# Patient Record
Sex: Male | Born: 1990 | Race: White | Hispanic: No | Marital: Married
Health system: Midwestern US, Academic
[De-identification: ages and names within clinical notes are randomized; demographics above are authoritative.]

---

## 2017-10-23 ENCOUNTER — Encounter: Admit: 2017-10-23 | Discharge: 2017-10-23 | Payer: Private health insurance—other commercial Indemnity

## 2017-10-23 ENCOUNTER — Ambulatory Visit: Admit: 2017-10-23 | Discharge: 2017-10-23 | Payer: Private Health Insurance - Indemnity

## 2017-10-23 ENCOUNTER — Encounter: Admit: 2017-10-23 | Discharge: 2017-10-24 | Payer: Private health insurance—other commercial Indemnity

## 2017-10-24 ENCOUNTER — Encounter: Admit: 2017-10-24 | Discharge: 2017-10-24 | Payer: Private health insurance—other commercial Indemnity

## 2017-10-24 ENCOUNTER — Encounter: Admit: 2017-10-24 | Discharge: 2017-10-24 | Payer: Private Health Insurance - Indemnity

## 2017-10-24 ENCOUNTER — Ambulatory Visit: Admit: 2017-10-24 | Discharge: 2017-10-25 | Payer: Private Health Insurance - Indemnity

## 2017-10-24 DIAGNOSIS — I499 Cardiac arrhythmia, unspecified: Principal | ICD-10-CM

## 2017-10-24 DIAGNOSIS — R42 Dizziness and giddiness: ICD-10-CM

## 2017-10-24 DIAGNOSIS — R000 Tachycardia, unspecified: Principal | ICD-10-CM

## 2017-10-24 DIAGNOSIS — R0602 Shortness of breath: ICD-10-CM

## 2017-10-24 DIAGNOSIS — R079 Chest pain, unspecified: Secondary | ICD-10-CM

## 2017-10-24 MED ORDER — METOPROLOL TARTRATE 25 MG PO TAB
25 mg | Freq: Once | ORAL | 0 refills | Status: DC
Start: 2017-10-24 — End: 2017-10-25

## 2017-10-24 MED ORDER — ENOXAPARIN 40 MG/0.4 ML SC SYRG
40 mg | Freq: Every day | SUBCUTANEOUS | 0 refills | Status: DC
Start: 2017-10-24 — End: 2017-10-28
  Administered 2017-10-27: 01:00:00 40 mg via SUBCUTANEOUS

## 2017-10-24 MED ORDER — MAGNESIUM SULFATE IN D5W 1 GRAM/100 ML IV PGBK
1 g | Freq: Once | INTRAVENOUS | 0 refills | Status: CP
Start: 2017-10-24 — End: ?
  Administered 2017-10-25: 05:00:00 1 g via INTRAVENOUS

## 2017-10-24 MED ORDER — POTASSIUM CHLORIDE 20 MEQ PO TBTQ
60 meq | Freq: Once | ORAL | 0 refills | Status: CP
Start: 2017-10-24 — End: ?
  Administered 2017-10-25: 05:00:00 60 meq via ORAL

## 2017-10-24 MED ORDER — IMS MIXTURE TEMPLATE
15 mg | Freq: Two times a day (BID) | ORAL | 0 refills | Status: DC
Start: 2017-10-24 — End: 2017-10-28
  Administered 2017-10-26 – 2017-10-28 (×8): 15 mg via ORAL

## 2017-10-24 MED ORDER — IBUPROFEN 600 MG PO TAB
600 mg | Freq: Once | ORAL | 0 refills | Status: AC
Start: 2017-10-24 — End: ?

## 2017-10-24 MED ORDER — METOPROLOL TARTRATE 25 MG PO TAB
25 mg | Freq: Once | ORAL | 0 refills | Status: CP
Start: 2017-10-24 — End: ?
  Administered 2017-10-25: 02:00:00 25 mg via ORAL

## 2017-10-24 MED ORDER — ACETAMINOPHEN 325 MG PO TAB
650 mg | ORAL | 0 refills | Status: DC | PRN
Start: 2017-10-24 — End: 2017-10-28
  Administered 2017-10-25 – 2017-10-28 (×3): 650 mg via ORAL

## 2017-10-25 ENCOUNTER — Encounter: Admit: 2017-10-25 | Discharge: 2017-10-25 | Payer: Private Health Insurance - Indemnity

## 2017-10-25 ENCOUNTER — Observation Stay: Admit: 2017-10-25 | Discharge: 2017-10-28 | Payer: Private Health Insurance - Indemnity

## 2017-10-25 DIAGNOSIS — I499 Cardiac arrhythmia, unspecified: Principal | ICD-10-CM

## 2017-10-25 LAB — URINALYSIS DIPSTICK
Lab: NEGATIVE
Lab: NEGATIVE K/UL (ref 3–12)
Lab: NEGATIVE MMOL/L (ref 21–30)
Lab: NEGATIVE U/L (ref 25–110)
Lab: NEGATIVE U/L (ref 7–40)
Lab: NEGATIVE

## 2017-10-25 LAB — OSMOLALITY-URINE RANDOM: Lab: 663 mosm/kg (ref 50–1400)

## 2017-10-25 LAB — PROTEINASE 3 ANTIBODIES

## 2017-10-25 LAB — CBC AND DIFF
Lab: 0.3 K/UL (ref 0–0.45)
Lab: 6.5 10*3/uL (ref 4.5–11.0)

## 2017-10-25 LAB — THYROID STIMULATING HORMONE-TSH: Lab: 2.9 uU/mL (ref 0.35–5.00)

## 2017-10-25 LAB — CREATINE KINASE-CPK: Lab: 121 U/L (ref 35–232)

## 2017-10-25 LAB — COMPREHENSIVE METABOLIC PANEL
Lab: 140 MMOL/L (ref 137–147)
Lab: 3.6 MMOL/L (ref 3.5–5.1)
Lab: 137 MMOL/L (ref 137–147)
Lab: 4.3 MMOL/L (ref 3.5–5.1)

## 2017-10-25 LAB — LIPID PROFILE
Lab: 170 mg/dL (ref <200)
Lab: 59 mg/dL (ref >40)
Lab: 63 mg/dL — ABNORMAL HIGH (ref <150)
Lab: 95 mg/dL (ref <100)

## 2017-10-25 LAB — CBC: Lab: 5.8 10*3/uL (ref 4.5–11.0)

## 2017-10-25 LAB — C4 COMPLEMENT 4: Lab: 36 mg/dL (ref 10–49)

## 2017-10-25 LAB — CELIAC SCREEN

## 2017-10-25 LAB — HEPATITIS B CORE AB TOT (IGG+IGM): Lab: NEGATIVE % — ABNORMAL HIGH (ref 5–15)

## 2017-10-25 LAB — FREE T4-FREE THYROXINE: Lab: 0.9 ng/dL (ref 0.6–1.6)

## 2017-10-25 LAB — C3 COMPLEMENT 3: Lab: 149 mg/dL (ref 88–200)

## 2017-10-25 LAB — HEPATITIS B SURFACE AB

## 2017-10-25 LAB — MAGNESIUM: Lab: 1.9 mg/dL (ref 1.6–2.6)

## 2017-10-25 LAB — SED RATE: Lab: 6 mm/h (ref 0–15)

## 2017-10-25 LAB — 25-OH VITAMIN D (D2 + D3): Lab: 24 ng/mL — ABNORMAL LOW (ref 30–80)

## 2017-10-25 LAB — HEPATITIS A IGM: Lab: NEGATIVE % (ref 48–68)

## 2017-10-25 LAB — URINALYSIS, MICROSCOPIC

## 2017-10-25 LAB — CCP IGG ANTIBODY

## 2017-10-25 LAB — GLIADIN, DEAMIDATED IGA: Lab: 2.7 U/mL (ref <15)

## 2017-10-25 LAB — VARICELLA ZOSTER AB IGG: Lab: POSITIVE /HPF (ref 0–3)

## 2017-10-25 LAB — TSH WITH FREE T4 REFLEX: Lab: 9.5 uU/mL — ABNORMAL HIGH (ref 0.35–5.00)

## 2017-10-25 LAB — MYELOPEROXIDASE AB

## 2017-10-25 LAB — ANTI SMITH(SM) ANTI RNP AB

## 2017-10-25 LAB — EBV DNA, PCR QUANT BLOOD

## 2017-10-25 LAB — RHEUMATOID FACTOR (RF): Lab: <10 [IU]/mL (ref <25)

## 2017-10-25 LAB — HIV 1& 2 AG-AB SCRN W REFLEX HIV 1 PCR QUANT: Lab: NEGATIVE mL/min

## 2017-10-25 LAB — C REACTIVE PROTEIN (CRP): Lab: 0.1 mg/dL (ref <1.0)

## 2017-10-25 MED ORDER — IOPAMIDOL 76 % IV SOLN
100 mL | Freq: Once | INTRAVENOUS | 0 refills | Status: CP
Start: 2017-10-25 — End: ?
  Administered 2017-10-25: 17:00:00 100 mL via INTRAVENOUS

## 2017-10-25 MED ORDER — METOPROLOL TARTRATE 25 MG PO TAB
25 mg | Freq: Once | ORAL | 0 refills | Status: CP
Start: 2017-10-25 — End: ?
  Administered 2017-10-25: 14:00:00 25 mg via ORAL

## 2017-10-25 MED ORDER — NITROGLYCERIN 400 MCG/SPRAY TL SPRY
1-2 | 0 refills | Status: DC | PRN
Start: 2017-10-25 — End: 2017-10-28
  Administered 2017-10-25: 17:00:00 2

## 2017-10-25 MED ORDER — DIPHENHYDRAMINE HCL 50 MG/ML IJ SOLN
50 mg | Freq: Once | INTRAVENOUS | 0 refills | Status: AC | PRN
Start: 2017-10-25 — End: ?

## 2017-10-25 MED ORDER — LORAZEPAM 0.5 MG PO TAB
.5 mg | Freq: Once | ORAL | 0 refills | Status: AC | PRN
Start: 2017-10-25 — End: ?

## 2017-10-25 MED ORDER — DIPHENHYDRAMINE HCL 50 MG PO CAP
50 mg | Freq: Once | ORAL | 0 refills | Status: AC | PRN
Start: 2017-10-25 — End: ?

## 2017-10-25 MED ORDER — SODIUM CHLORIDE 0.9 % IV SOLP
250 mL | INTRAVENOUS | 0 refills | Status: AC
Start: 2017-10-25 — End: ?

## 2017-10-25 MED ORDER — SODIUM CHLORIDE 0.9 % IV SOLP
250 mL | INTRAVENOUS | 0 refills | Status: AC | PRN
Start: 2017-10-25 — End: ?

## 2017-10-25 MED ORDER — LORAZEPAM 0.5 MG PO TAB
.25 mg | Freq: Once | ORAL | 0 refills | Status: CP
Start: 2017-10-25 — End: ?
  Administered 2017-10-25: 12:00:00 0.25 mg via ORAL

## 2017-10-25 MED ORDER — METHYLPREDNISOLONE SOD SUC(PF) 125 MG/2 ML IJ SOLR
125 mg | Freq: Once | INTRAVENOUS | 0 refills | Status: AC | PRN
Start: 2017-10-25 — End: ?

## 2017-10-25 MED ORDER — METOPROLOL TARTRATE 5 MG/5 ML IV SOLN
5 mg | INTRAVENOUS | 0 refills | Status: DC | PRN
Start: 2017-10-25 — End: 2017-10-28

## 2017-10-25 MED ORDER — SODIUM CHLORIDE 0.9 % IJ SOLN
50 mL | Freq: Once | INTRAVENOUS | 0 refills | Status: CP
Start: 2017-10-25 — End: ?
  Administered 2017-10-25: 17:00:00 50 mL via INTRAVENOUS

## 2017-10-26 DIAGNOSIS — R079 Chest pain, unspecified: Secondary | ICD-10-CM

## 2017-10-26 LAB — CBC
Lab: 14 g/dL (ref 13.5–16.5)
Lab: 278 10*3/uL (ref 150–400)
Lab: 31 pg (ref 26–34)
Lab: 33 g/dL (ref 32.0–36.0)
Lab: 4.5 M/UL (ref 4.4–5.5)
Lab: 43 % (ref 40–50)
Lab: 6 10*3/uL (ref 4.5–11.0)
Lab: 8.5 FL (ref 7–11)
Lab: 94 FL (ref 80–100)

## 2017-10-26 LAB — URINALYSIS, MICROSCOPIC

## 2017-10-26 LAB — BASIC METABOLIC PANEL
Lab: 105 MMOL/L (ref 98–110)
Lab: 141 MMOL/L — ABNORMAL HIGH (ref 137–147)
Lab: 16 mg/dL — ABNORMAL LOW (ref 7–25)
Lab: 28 MMOL/L — ABNORMAL LOW (ref 21–30)
Lab: 8 % — ABNORMAL LOW (ref 3–12)
Lab: 9.6 mg/dL — ABNORMAL HIGH (ref 8.5–10.6)
Lab: 93 mg/dL (ref 70–100)
Lab: >60 mL/min — ABNORMAL LOW (ref >60)

## 2017-10-26 LAB — ADENOVIRUS QUANT PCR PLASMA

## 2017-10-26 LAB — HERPES SIMPLEX VIRUS 1 & 2 QUANTITATIVE REAL-TIME PCR. PLASMA

## 2017-10-26 LAB — ALDOLASE: Lab: 7.8 — ABNORMAL HIGH

## 2017-10-26 LAB — CMV AB IGM: Lab: NEGATIVE MMOL/L (ref 98–110)

## 2017-10-26 LAB — POC GLUCOSE: Lab: 94 mg/dL (ref 70–100)

## 2017-10-26 LAB — URINE COLLECTION
Lab: 24 U/L (ref 7–56)
Lab: 249 mL (ref 3–12)

## 2017-10-26 LAB — MAGNESIUM: Lab: 2 mg/dL — ABNORMAL LOW (ref 1.6–2.6)

## 2017-10-26 MED ORDER — ATENOLOL 25 MG PO TAB
12.5 mg | Freq: Every day | ORAL | 0 refills | Status: DC
Start: 2017-10-26 — End: 2017-10-27
  Administered 2017-10-26 – 2017-10-27 (×2): 12.5 mg via ORAL

## 2017-10-26 MED ORDER — GADOBENATE DIMEGLUMINE 529 MG/ML (0.1MMOL/0.2ML) IV SOLN
24 mL | Freq: Once | INTRAVENOUS | 0 refills | Status: CP
Start: 2017-10-26 — End: ?
  Administered 2017-10-26: 21:00:00 24 mL via INTRAVENOUS

## 2017-10-26 MED ORDER — LORAZEPAM 0.5 MG PO TAB
.5 mg | Freq: Once | ORAL | 0 refills | Status: CP
Start: 2017-10-26 — End: ?
  Administered 2017-10-26: 19:00:00 0.5 mg via ORAL

## 2017-10-26 MED ORDER — BENZOCAINE-MENTHOL 6-10 MG MM LOZG
1 | Freq: Once | ORAL | 0 refills | Status: CP
Start: 2017-10-26 — End: ?
  Administered 2017-10-27: 01:00:00 1 via ORAL

## 2017-10-27 LAB — BASIC METABOLIC PANEL
Lab: 1 mg/dL (ref 0.4–1.24)
Lab: 142 MMOL/L — ABNORMAL LOW (ref 137–147)
Lab: 15 mg/dL (ref 7–25)
Lab: 29 MMOL/L (ref 21–30)
Lab: 4.8 MMOL/L — ABNORMAL LOW (ref 3.5–5.1)
Lab: 9.8 mg/dL (ref 8.5–10.6)
Lab: 96 mg/dL (ref 70–100)
Lab: >60 mL/min (ref >60)
Lab: >60 mL/min (ref >60)

## 2017-10-27 LAB — MAGNESIUM: Lab: 1.9 mg/dL (ref 1.6–2.6)

## 2017-10-27 LAB — VARICELLA ZOSTER AB IGM: Lab: NEGATIVE

## 2017-10-27 LAB — CBC: Lab: 8.2 K/UL — ABNORMAL LOW (ref >60)

## 2017-10-27 MED ORDER — BENZOCAINE-MENTHOL 6-10 MG MM LOZG
1 | ORAL | 0 refills | Status: DC | PRN
Start: 2017-10-27 — End: 2017-10-28
  Administered 2017-10-27: 10:00:00 1 via ORAL

## 2017-10-27 MED ORDER — FLUDROCORTISONE 0.1 MG PO TAB
0.1 mg | Freq: Every day | ORAL | 0 refills | Status: DC
Start: 2017-10-27 — End: 2017-10-28
  Administered 2017-10-27 – 2017-10-28 (×2): 0.1 mg via ORAL

## 2017-10-27 MED ORDER — ATENOLOL 25 MG PO TAB
12.5 mg | Freq: Once | ORAL | 0 refills | Status: CP
Start: 2017-10-27 — End: ?
  Administered 2017-10-27: 16:00:00 12.5 mg via ORAL

## 2017-10-27 MED ORDER — ATENOLOL 50 MG PO TAB
25 mg | Freq: Every day | ORAL | 0 refills | Status: DC
Start: 2017-10-27 — End: 2017-10-28
  Administered 2017-10-28: 14:00:00 25 mg via ORAL

## 2017-10-28 ENCOUNTER — Encounter: Admit: 2017-10-28 | Discharge: 2017-10-28 | Payer: Private health insurance—other commercial Indemnity

## 2017-10-28 ENCOUNTER — Inpatient Hospital Stay: Admit: 2017-10-25 | Discharge: 2017-10-26 | Payer: Private health insurance—other commercial Indemnity

## 2017-10-28 ENCOUNTER — Inpatient Hospital Stay: Admit: 2017-10-25 | Discharge: 2017-10-25 | Payer: Private Health Insurance - Indemnity

## 2017-10-28 ENCOUNTER — Observation Stay: Admit: 2017-10-26 | Discharge: 2017-10-26 | Payer: Private Health Insurance - Indemnity

## 2017-10-28 ENCOUNTER — Encounter: Admit: 2017-10-28 | Discharge: 2017-10-28 | Payer: Private Health Insurance - Indemnity

## 2017-10-28 DIAGNOSIS — R000 Tachycardia, unspecified: ICD-10-CM

## 2017-10-28 DIAGNOSIS — I498 Other specified cardiac arrhythmias: ICD-10-CM

## 2017-10-28 DIAGNOSIS — Z7952 Long term (current) use of systemic steroids: ICD-10-CM

## 2017-10-28 DIAGNOSIS — Z8249 Family history of ischemic heart disease and other diseases of the circulatory system: ICD-10-CM

## 2017-10-28 DIAGNOSIS — F429 Obsessive-compulsive disorder, unspecified: ICD-10-CM

## 2017-10-28 DIAGNOSIS — I951 Orthostatic hypotension: ICD-10-CM

## 2017-10-28 DIAGNOSIS — R079 Chest pain, unspecified: ICD-10-CM

## 2017-10-28 DIAGNOSIS — Z0489 Encounter for examination and observation for other specified reasons: ICD-10-CM

## 2017-10-28 DIAGNOSIS — F4010 Social phobia, unspecified: ICD-10-CM

## 2017-10-28 DIAGNOSIS — Z79899 Other long term (current) drug therapy: ICD-10-CM

## 2017-10-28 DIAGNOSIS — R358 Other polyuria: Principal | ICD-10-CM

## 2017-10-28 LAB — COXSACKIE A 9

## 2017-10-28 LAB — MAGNESIUM: Lab: 1.8 mg/dL — ABNORMAL HIGH (ref >60)

## 2017-10-28 LAB — ELECTROPHORESIS-SERUM PROTEIN
Lab: 11 % (ref 9–21)
Lab: 6.5 g/dL (ref 6.0–8.0)

## 2017-10-28 LAB — ANTI-NUCLEAR ANTIBODY(ANA): Lab: <80 {titer} (ref <80)

## 2017-10-28 LAB — CBC: Lab: 5.7 K/UL — ABNORMAL LOW (ref >60)

## 2017-10-28 LAB — BASIC METABOLIC PANEL: Lab: 139 MMOL/L — ABNORMAL LOW (ref 137–147)

## 2017-10-28 LAB — ANTI-DNA DOUBLE STRAND: Lab: <10 {titer} (ref <10)

## 2017-10-28 LAB — LYSOZYME: Lab: 3.2

## 2017-10-28 MED ORDER — ATENOLOL 25 MG PO TAB
25 mg | ORAL_TABLET | Freq: Every day | ORAL | 3 refills | 33.00000 days | Status: AC
Start: 2017-10-28 — End: 2018-05-02
  Filled 2017-10-28 (×2): qty 90, 30d supply, fill #1

## 2017-10-28 MED ORDER — FLUDROCORTISONE 0.1 MG PO TAB
.1 mg | ORAL_TABLET | Freq: Every day | ORAL | 1 refills | 90.00000 days | Status: AC
Start: 2017-10-28 — End: 2017-11-15
  Filled 2017-10-28 (×2): qty 90, 30d supply, fill #1

## 2017-10-29 LAB — SOLUBLE IL2 RECEPTOR: Lab: 41

## 2017-10-30 LAB — METANEPHRINES-URINE 24 HR
Lab: 107
Lab: 24
Lab: 244
Lab: 249
Lab: 137

## 2017-11-01 ENCOUNTER — Encounter: Admit: 2017-11-01 | Discharge: 2017-11-01 | Payer: Private Health Insurance - Indemnity

## 2017-11-01 DIAGNOSIS — I499 Cardiac arrhythmia, unspecified: Principal | ICD-10-CM

## 2017-11-03 LAB — COXSACKIE B 1-6

## 2017-11-03 LAB — ECHOVIRUS AB PANEL

## 2017-11-15 ENCOUNTER — Ambulatory Visit: Admit: 2017-11-15 | Discharge: 2017-11-16 | Payer: Private Health Insurance - Indemnity

## 2017-11-15 ENCOUNTER — Encounter: Admit: 2017-11-15 | Discharge: 2017-11-15 | Payer: Private health insurance—other commercial Indemnity

## 2017-11-15 DIAGNOSIS — R000 Tachycardia, unspecified: Principal | ICD-10-CM

## 2017-11-15 DIAGNOSIS — I499 Cardiac arrhythmia, unspecified: Principal | ICD-10-CM

## 2017-11-15 MED ORDER — FLUDROCORTISONE 0.1 MG PO TAB
.1 mg | ORAL_TABLET | Freq: Every day | ORAL | 1 refills | Status: CN
Start: 2017-11-15 — End: ?

## 2017-11-25 ENCOUNTER — Encounter: Admit: 2017-11-25 | Discharge: 2017-11-25 | Payer: Private Health Insurance - Indemnity

## 2017-12-03 ENCOUNTER — Encounter: Admit: 2017-12-03 | Discharge: 2017-12-03 | Payer: Private Health Insurance - Indemnity

## 2017-12-12 LAB — COMPREHENSIVE METABOLIC PANEL
Lab: 1.2 — ABNORMAL HIGH (ref 0.72–1.25)
Lab: 10 — ABNORMAL HIGH (ref 8.4–10.2)
Lab: 103
Lab: 142
Lab: 16 — ABNORMAL HIGH (ref 0–14)
Lab: 26 — ABNORMAL HIGH (ref 8.9–20.6)
Lab: 27
Lab: 32
Lab: 35 — ABNORMAL HIGH (ref 5–34)
Lab: 4.6
Lab: 54
Lab: 7.7
Lab: 73
Lab: 98

## 2017-12-12 LAB — CREATINE KINASE-CPK: Lab: 129 — ABNORMAL HIGH (ref 30–200)

## 2017-12-27 LAB — THYROID STIMULATING HORMONE-TSH: Lab: 1.2

## 2017-12-27 LAB — AMYLASE: Lab: 59

## 2017-12-27 LAB — CREATINE KINASE-CPK: Lab: 126

## 2017-12-27 LAB — HEMOGLOBIN A1C: Lab: 5.1

## 2017-12-27 LAB — COMPREHENSIVE METABOLIC PANEL
Lab: 10
Lab: 12
Lab: 142
Lab: 22
Lab: 92

## 2017-12-27 LAB — LIPASE: Lab: 15

## 2017-12-27 LAB — C REACTIVE PROT-HI SENSITIVITY: Lab: 0.1 — ABNORMAL HIGH (ref 22–29)

## 2017-12-30 ENCOUNTER — Ambulatory Visit: Admit: 2017-12-30 | Discharge: 2017-12-30 | Payer: Private Health Insurance - Indemnity

## 2017-12-30 ENCOUNTER — Encounter: Admit: 2017-12-30 | Discharge: 2017-12-30 | Payer: Private Health Insurance - Indemnity

## 2017-12-30 DIAGNOSIS — I498 Other specified cardiac arrhythmias: Principal | ICD-10-CM

## 2017-12-30 DIAGNOSIS — I499 Cardiac arrhythmia, unspecified: Principal | ICD-10-CM

## 2018-01-01 ENCOUNTER — Encounter: Admit: 2018-01-01 | Discharge: 2018-01-01 | Payer: Private Health Insurance - Indemnity

## 2018-01-01 DIAGNOSIS — I499 Cardiac arrhythmia, unspecified: Principal | ICD-10-CM

## 2018-01-06 ENCOUNTER — Emergency Department: Admit: 2018-01-06 | Discharge: 2018-01-06 | Payer: Private Health Insurance - Indemnity | Attending: Primary Care

## 2018-01-06 ENCOUNTER — Emergency Department
Admit: 2018-01-06 | Discharge: 2018-01-06 | Disposition: A | Discharge status: Home / Self Care | Payer: Private Health Insurance - Indemnity | Attending: Primary Care

## 2018-01-06 ENCOUNTER — Encounter: Admit: 2018-01-06 | Discharge: 2018-01-06 | Payer: Private health insurance—other commercial Indemnity

## 2018-01-06 DIAGNOSIS — R1084 Generalized abdominal pain: Principal | ICD-10-CM

## 2018-01-06 DIAGNOSIS — R197 Diarrhea, unspecified: ICD-10-CM

## 2018-01-06 DIAGNOSIS — K921 Melena: ICD-10-CM

## 2018-01-06 LAB — URINALYSIS, MICROSCOPIC

## 2018-01-06 LAB — POC LACTATE: Lab: 1.3 MMOL/L (ref 0.5–2.0)

## 2018-01-06 LAB — URINALYSIS DIPSTICK
Lab: 7 mg/dL (ref 5.0–8.0)
Lab: NEGATIVE U/L — ABNORMAL LOW (ref 25–110)
Lab: NEGATIVE g/dL (ref 3.5–5.0)
Lab: NEGATIVE mg/dL (ref 0.3–1.2)
Lab: NEGATIVE mg/dL (ref 8.5–10.6)

## 2018-01-06 LAB — SED RATE: Lab: 10 mm/h (ref 0–15)

## 2018-01-06 LAB — COMPREHENSIVE METABOLIC PANEL
Lab: 143 MMOL/L — ABNORMAL LOW (ref 137–147)
Lab: 3.9 MMOL/L (ref 3.5–5.1)

## 2018-01-06 LAB — POC CREATININE, RAD: Lab: 1.1 mg/dL — ABNORMAL HIGH (ref >60)

## 2018-01-06 LAB — LIPASE: Lab: 19 U/L (ref 11–82)

## 2018-01-06 LAB — CBC AND DIFF: Lab: 8.1 10*3/uL (ref 4.5–11.0)

## 2018-01-06 LAB — C REACTIVE PROTEIN (CRP): Lab: 0.3 mg/dL (ref <1.0)

## 2018-01-06 MED ORDER — IOHEXOL 350 MG IODINE/ML IV SOLN
100 mL | Freq: Once | INTRAVENOUS | 0 refills | Status: CP
Start: 2018-01-06 — End: ?
  Administered 2018-01-06: 20:00:00 100 mL via INTRAVENOUS

## 2018-01-06 MED ORDER — SODIUM CHLORIDE 0.9 % IJ SOLN
50 mL | Freq: Once | INTRAVENOUS | 0 refills | Status: CP
Start: 2018-01-06 — End: ?
  Administered 2018-01-06: 20:00:00 50 mL via INTRAVENOUS

## 2018-01-06 MED ORDER — LACTATED RINGERS IV SOLP
1000 mL | INTRAVENOUS | 0 refills | Status: CP
Start: 2018-01-06 — End: ?
  Administered 2018-01-06: 20:00:00 1000 mL via INTRAVENOUS

## 2018-01-06 MED ORDER — KETOROLAC 15 MG/ML IJ SOLN
15 mg | Freq: Once | INTRAVENOUS | 0 refills | Status: CP
Start: 2018-01-06 — End: ?
  Administered 2018-01-06: 20:00:00 15 mg via INTRAVENOUS

## 2018-01-07 LAB — CRYPTOSPORIDUM,FECAL: Lab: NEGATIVE

## 2018-01-07 LAB — GIARDIA SCREEN,FECAL: Lab: NEGATIVE 10*3/uL (ref 0–0.45)

## 2018-01-07 LAB — OVA AND PARASITES, FECAL

## 2018-01-07 LAB — LEUKOCYTES, FECAL

## 2018-01-10 ENCOUNTER — Encounter: Admit: 2018-01-10 | Discharge: 2018-01-10

## 2018-01-15 ENCOUNTER — Encounter: Admit: 2018-01-15 | Discharge: 2018-01-15

## 2018-02-14 ENCOUNTER — Encounter: Admit: 2018-02-14 | Discharge: 2018-02-14 | Payer: Private Health Insurance - Indemnity

## 2018-04-03 ENCOUNTER — Encounter: Admit: 2018-04-03 | Discharge: 2018-04-03 | Payer: Private Health Insurance - Indemnity

## 2018-04-07 ENCOUNTER — Encounter: Admit: 2018-04-07 | Discharge: 2018-04-07 | Payer: Private health insurance—other commercial Indemnity

## 2018-04-07 ENCOUNTER — Encounter: Admit: 2018-04-07 | Discharge: 2018-04-07 | Payer: Private Health Insurance - Indemnity

## 2018-04-07 ENCOUNTER — Ambulatory Visit: Admit: 2018-04-07 | Discharge: 2018-04-08 | Payer: Private Health Insurance - Indemnity

## 2018-04-07 DIAGNOSIS — K921 Melena: Secondary | ICD-10-CM

## 2018-04-07 DIAGNOSIS — I499 Cardiac arrhythmia, unspecified: Principal | ICD-10-CM

## 2018-04-08 DIAGNOSIS — R109 Unspecified abdominal pain: Principal | ICD-10-CM

## 2018-04-08 DIAGNOSIS — R197 Diarrhea, unspecified: ICD-10-CM

## 2018-04-10 ENCOUNTER — Encounter: Admit: 2018-04-10 | Discharge: 2018-04-10 | Payer: Private health insurance—other commercial Indemnity

## 2018-04-10 MED ORDER — VANCOMYCIN 125 MG PO CAP
125 mg | ORAL_CAPSULE | Freq: Four times a day (QID) | ORAL | 0 refills | 10.00000 days | Status: AC
Start: 2018-04-10 — End: ?

## 2018-04-14 ENCOUNTER — Encounter: Admit: 2018-04-14 | Discharge: 2018-04-14 | Payer: Private Health Insurance - Indemnity

## 2018-04-14 NOTE — Progress Notes
Called patient to discuss symptoms since starting PO vancomycin and his calprotectin result.    This is his 3rd date of treatment.  Reports stools are soft and still seeing some undigested food material.    Fecal calprotectin negative.    Recommend completing treatment of vancomycin and then seeing if symptoms resolve.  He has CTE scheduled later this month after he completes treatment to evaluate for small bowel disease.    All questions answered at time of call.

## 2018-04-21 ENCOUNTER — Encounter: Admit: 2018-04-21 | Discharge: 2018-04-21 | Payer: Private Health Insurance - Indemnity

## 2018-04-25 ENCOUNTER — Encounter: Admit: 2018-04-25 | Discharge: 2018-04-25 | Payer: Private health insurance—other commercial Indemnity

## 2018-04-25 ENCOUNTER — Ambulatory Visit: Admit: 2018-04-25 | Discharge: 2018-04-25 | Payer: Private Health Insurance - Indemnity

## 2018-04-25 DIAGNOSIS — R109 Unspecified abdominal pain: Principal | ICD-10-CM

## 2018-04-25 DIAGNOSIS — K921 Melena: ICD-10-CM

## 2018-04-25 DIAGNOSIS — R197 Diarrhea, unspecified: ICD-10-CM

## 2018-04-25 MED ORDER — SODIUM CHLORIDE 0.9 % IJ SOLN
50 mL | Freq: Once | INTRAVENOUS | 0 refills | Status: CP
Start: 2018-04-25 — End: ?
  Administered 2018-04-25: 19:00:00 50 mL via INTRAVENOUS

## 2018-04-25 MED ORDER — GLUCAGON HCL 1 MG/ML IJ SOLR
1 mg | Freq: Once | INTRAMUSCULAR | 0 refills | Status: CP
Start: 2018-04-25 — End: ?
  Administered 2018-04-25: 19:00:00 1 mg via INTRAMUSCULAR

## 2018-04-25 MED ORDER — IOHEXOL 350 MG IODINE/ML IV SOLN
100 mL | Freq: Once | INTRAVENOUS | 0 refills | Status: CP
Start: 2018-04-25 — End: ?
  Administered 2018-04-25: 19:00:00 100 mL via INTRAVENOUS

## 2018-04-25 MED ORDER — BREEZA FOR NEUTRAL ABDOMINAL/PELVIC IMAGING PO SOLN
1500 mL | Freq: Once | ORAL | 0 refills | Status: CP
Start: 2018-04-25 — End: ?

## 2018-04-28 ENCOUNTER — Encounter: Admit: 2018-04-28 | Discharge: 2018-04-28 | Payer: Private Health Insurance - Indemnity

## 2018-04-29 ENCOUNTER — Encounter: Admit: 2018-04-29 | Discharge: 2018-04-29 | Payer: Private Health Insurance - Indemnity

## 2018-05-01 ENCOUNTER — Encounter: Admit: 2018-05-01 | Discharge: 2018-05-01 | Payer: Private Health Insurance - Indemnity

## 2018-05-02 ENCOUNTER — Ambulatory Visit: Admit: 2018-05-02 | Discharge: 2018-05-02 | Payer: Private Health Insurance - Indemnity

## 2018-05-02 ENCOUNTER — Encounter: Admit: 2018-05-02 | Discharge: 2018-05-02 | Payer: Private health insurance—other commercial Indemnity

## 2018-05-02 DIAGNOSIS — R0602 Shortness of breath: ICD-10-CM

## 2018-05-02 DIAGNOSIS — A0472 Enterocolitis due to Clostridium difficile, not specified as recurrent: ICD-10-CM

## 2018-05-02 DIAGNOSIS — I498 Other specified cardiac arrhythmias: Principal | ICD-10-CM

## 2018-05-02 DIAGNOSIS — I499 Cardiac arrhythmia, unspecified: Principal | ICD-10-CM

## 2018-05-02 DIAGNOSIS — R42 Dizziness and giddiness: ICD-10-CM

## 2018-05-02 MED ORDER — ATENOLOL 25 MG PO TAB
37.5 mg | ORAL_TABLET | Freq: Every day | ORAL | 3 refills | 33.00000 days | Status: AC
Start: 2018-05-02 — End: ?

## 2018-05-05 ENCOUNTER — Encounter: Admit: 2018-05-05 | Discharge: 2018-05-05 | Payer: Private health insurance—other commercial Indemnity

## 2018-05-08 ENCOUNTER — Encounter: Admit: 2018-05-08 | Discharge: 2018-05-08 | Payer: Private health insurance—other commercial Indemnity

## 2018-05-08 DIAGNOSIS — R109 Unspecified abdominal pain: Principal | ICD-10-CM

## 2018-05-08 DIAGNOSIS — R197 Diarrhea, unspecified: ICD-10-CM

## 2018-05-08 DIAGNOSIS — K921 Melena: ICD-10-CM

## 2018-05-16 ENCOUNTER — Encounter: Admit: 2018-05-16 | Discharge: 2018-05-16 | Payer: Private health insurance—other commercial Indemnity

## 2018-05-16 DIAGNOSIS — R109 Unspecified abdominal pain: Principal | ICD-10-CM

## 2018-05-16 DIAGNOSIS — K921 Melena: ICD-10-CM

## 2018-05-16 DIAGNOSIS — R197 Diarrhea, unspecified: ICD-10-CM

## 2018-05-16 LAB — CBC AND DIFF
Lab: 0.1
Lab: 0.4
Lab: 0.4
Lab: 32
Lab: 5.6
Lab: 9.5
Lab: 0
Lab: 0
Lab: 14
Lab: 4.5
Lab: 44
Lab: 6.7
Lab: 8
Lab: 98

## 2018-05-16 LAB — COMPREHENSIVE METABOLIC PANEL
Lab: 0.3
Lab: 104
Lab: 14
Lab: 141
Lab: 27
Lab: 0.9
Lab: 123
Lab: 4.3
Lab: 4.8
Lab: 7.3
Lab: 9.9

## 2018-05-16 LAB — IRON + BINDING CAPACITY + %SAT+ FERRITIN
Lab: 380
Lab: 24
Lab: 91

## 2018-05-16 LAB — VITAMIN B12: Lab: 712

## 2018-05-16 LAB — C DIFFICILE BY PCR: Lab: POSITIVE

## 2018-05-21 ENCOUNTER — Encounter: Admit: 2018-05-21 | Discharge: 2018-05-21 | Payer: Private Health Insurance - Indemnity

## 2018-05-21 ENCOUNTER — Encounter: Admit: 2018-05-21 | Discharge: 2018-05-21 | Payer: Private health insurance—other commercial Indemnity

## 2018-05-21 NOTE — Progress Notes
Received results from repeat C diff stool test from [facility] via fax.     Routing to Dr. Allena Katz for recommendations.

## 2018-05-22 MED ORDER — FIDAXOMICIN 200 MG PO TAB
200 mg | ORAL_TABLET | Freq: Two times a day (BID) | ORAL | 0 refills | 20.00000 days | Status: AC
Start: 2018-05-22 — End: ?

## 2018-06-03 ENCOUNTER — Encounter: Admit: 2018-06-03 | Discharge: 2018-06-03 | Payer: Private Health Insurance - Indemnity

## 2018-06-03 ENCOUNTER — Encounter: Admit: 2018-06-03 | Discharge: 2018-06-03 | Payer: Private health insurance—other commercial Indemnity

## 2018-06-03 NOTE — Telephone Encounter
Call paced to pt to discuss upcoming appt with Dr. Allena Katz on 6/15. Pt would like to conduct appt via telehealth so that his wife an mother can join in the discussion. Pt reported that he finished antibiotics course on 5/25. He has a seen a crease in stool frequency of 2-3 BM/day rather than 5-6. Stools are still a runny/stringy consistency.   MyChart message sent to pt with instructions for telehealth.

## 2018-06-23 ENCOUNTER — Encounter: Admit: 2018-06-23 | Discharge: 2018-06-23

## 2018-06-23 DIAGNOSIS — I499 Cardiac arrhythmia, unspecified: Secondary | ICD-10-CM

## 2018-06-23 NOTE — Telephone Encounter
-----   Message from Richard Miu, RN sent at 06/23/2018  2:38 PM CDT -----  Regarding: FW: Non-Urgent Medical Question  Contact: [phone]    ----- Message -----  From: Palma Holter  Sent: 06/23/2018   1:18 PM CDT  To: [facility] Nurse Triage [place]  Subject: Non-Urgent Medical Question                      Hello,    I have been experiencing waves of confusion, trouble forming sentences at times, and many instances of numbness in my left hand and legs for the past week. I was hoping this would go away, but it hasn't. I was just wondering if this is POTS related and also what I could do to help control the symptoms I've been having. I also didn't know if I should be concerned about this or not.     Thanks,    Jesse Peters

## 2018-06-23 NOTE — Progress Notes
Date of Service: 06/23/2018      History of Present Illness  Jesse Peters is a 28 y.o. 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    He was initially seen in my clinic in [place] 2020 via telemedicine for hematochezia, diarrhea, and abdominal pain.  There had been concern that he had IBD and he was on prednisone at that time.  Imaging and fecal calprotectin were negative for IBD, however, he was found to be positive for C diff.  He was initially treated with a course of PO vancomycin, however, he continued to have unchanged diarrhea and repeat testing on 05/21/18 showed persistent C diff.  He was then treated with a 10 day course of fidaxomicin.  Since that time he reports that his number of bowel movements have decreased from 5-6 a day to 3 a day.  Stools have also changed from [facility] type 7 to [place] type 6.  He reports over the last 2 months he has had tenesmus.  He sees undigested food in stools including corn and blueberries.  He has not been on any antibiotics recently.  He is off of his prednisone as of June 1st.      He reports having LUE or RLQ pain 20 minutes after eating.  No alleviating factors.  Pain lasts for 15 minutes.  Does not change with bowel movements.  Pain does not radiate.         Review of Systems   Constitutional: Positive for fatigue.   HENT: Positive for sinus pressure and trouble swallowing.    Respiratory: Positive for chest tightness.    Cardiovascular: Positive for palpitations.   Gastrointestinal: Positive for abdominal distention, abdominal pain, blood in stool, diarrhea, nausea and rectal pain.   Endocrine: Positive for polyuria.   Genitourinary: Positive for frequency and urgency.   Musculoskeletal: Positive for neck stiffness.   Neurological: Positive for light-headedness and numbness.   All other systems reviewed and are negative.    Objective: ??? atenoloL (TENORMIN) 25 mg tablet Take 1.5 tablets by mouth daily.   ??? busPIRone (BUSPAR) 15 mg tablet Take 15 mg by mouth twice daily.   ??? dicyclomine (BENTYL) 20 mg tablet TAKE 1 TABLET BY MOUTH 4 TIMES DAILY AS NEEDED FOR ABDOMINAL CRAMPING   ??? predniSONE (DELTASONE) 10 mg tablet Take 10 mg by mouth daily.     Vitals:    06/23/18 0842   BP: 117/67   BP Source: Arm, Left Upper   Patient Position: Sitting   Pulse: 70   Resp: 18   Temp: 36.3 ???C (97.3 ???F)   TempSrc: Oral   Weight: 68.9 kg (152 lb)   Height: 162.6 cm (64)   PainSc: Two     Body mass index is 26.09 kg/m???.     Physical Exam  Vitals signs reviewed.   Constitutional:       Appearance: Normal appearance.   HENT:      Head: Normocephalic.      Nose: Nose normal.      Mouth/Throat:      Mouth: Mucous membranes are moist.   Eyes:      Extraocular Movements: Extraocular movements intact.   Neck:      Musculoskeletal: Normal range of motion.   Cardiovascular:      Rate and Rhythm: Normal rate and regular rhythm.      Pulses: Normal pulses.      Heart  sounds: Normal heart sounds.   Pulmonary:      Effort: Pulmonary effort is normal.      Breath sounds: Normal breath sounds.   Abdominal:      General: Abdomen is flat. Bowel sounds are normal.      Palpations: Abdomen is soft.   Musculoskeletal: Normal range of motion.         General: No swelling.   Skin:     General: Skin is warm and dry.   Neurological:      General: No focal deficit present.      Mental Status: He is alert and oriented to person, place, and time.   Psychiatric:         Mood and Affect: Mood normal.          Assessment and Plan:  Jesse Peters is a 28 y.o. male with a history of POTS and C diff infection who presents to [facility] clinic for follow up.    1. History of C diff infection:  He was initially diagnosed on March 30th and was treated with 10 day course of PO vancomycin.  Diarrhea continued and repeat c diff testing was positive on 05/21/18.  He was then treated with 10 day course of Fidaxomicin and stools have improved from 6/day to 3/day and [place] type has changed from 7 to 6.    2.  Diarrhea:  He continues to have rapid transit, associated abdominal pain, and tenesmus.  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  He has a family history of IBD in a second degree relative.  Prior infectious disease work up was negative other than C diff.  Fecal calprotectin was normal previously.    - check C diff, if positive we will discuss fecal transplant  - if C diff is negative, consider cholestyramine and schedule for EGD and colonoscopy for further investigation of ongoing diarrhea  - may consider TCA in the future if IBS is most likely diagnosis  - I discussed diagnosis of post infectious IBS with the patient and his mother today at the visit as well as the natural timeline of this disease    3.  Abdominal pain:  Suspect this is related to refractory C diff vs IBD  - workup as above    4.  POTS  - follow up with cardiologist.  I did advise him to contact his cardiologist today as he was reporting recent symptoms of chest heaviness and resting heart rate of 115 over the weekend.  His HR was normal during our visit today.      Return to clinic in 3 months

## 2018-06-23 NOTE — Telephone Encounter
Reviewed with MPE as RAD is still in procedures.   Per MPE- avoid ETOH, increase hydration, call with any recurrence of symptoms and follow up with RAD or APP.  Discussed recommendations with patient who verbalizes understanding. He is scheduled for f/u with RAD 6/23.

## 2018-06-24 ENCOUNTER — Ambulatory Visit: Admit: 2018-06-23 | Discharge: 2018-06-24

## 2018-06-24 DIAGNOSIS — R197 Diarrhea, unspecified: Secondary | ICD-10-CM

## 2018-06-24 DIAGNOSIS — Z8619 Personal history of other infectious and parasitic diseases: Secondary | ICD-10-CM

## 2018-06-27 ENCOUNTER — Encounter: Admit: 2018-06-27 | Discharge: 2018-06-27

## 2018-06-27 NOTE — Telephone Encounter
Received VM from pt reporting he has received the results from [facility] on repeat C dif testing.     Retruned call to pt. Pt reports that he received results on his pt ortal through [facility] that his repeat C diff ordered after LOV on 6/15 was positive. Per LOV note if positive again- to discuss FMT. No results received by our office at this time. Results requested to be faxed to our office. Asked pt to attache a photo of ht results in a MyChart message at this time until results are received via fax. Pt verbalized understanding with no further questions or concerns at this time.

## 2018-06-27 NOTE — Telephone Encounter
Received fax from [facility] with pt's C diff results ordered from [place] on 6/15.     Routing to Dr. Posey Pronto for recommendations.

## 2018-06-30 NOTE — Telephone Encounter
Received the following update from pt via MyChart message.

## 2018-07-01 ENCOUNTER — Encounter: Admit: 2018-07-01 | Discharge: 2018-07-01

## 2018-07-01 DIAGNOSIS — I499 Cardiac arrhythmia, unspecified: Secondary | ICD-10-CM

## 2018-07-01 MED ORDER — VANCOMYCIN 125 MG PO CAP
ORAL_CAPSULE | Freq: Four times a day (QID) | ORAL | 0 refills | 10.00000 days | Status: AC
Start: 2018-07-01 — End: ?

## 2018-07-01 NOTE — Progress Notes
Date of Service: 07/01/2018    Jesse Peters is a 28 y.o. male.       HPI     I had the pleasure of seeing Jesse Peters in our office today for cardiac electrophysiology consultation after recent hospitalization for chest pressure and tachycardia. ???He was considered to have diagnosis of POTS at that time. However he does not have required objective findings.  ???  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 ???It did not get better. ???Therefore, he was treated with prednisone and antibiotics on a Monday. ???He went back on a Wednesday to [facility] Room after feeling dizzy. ???He had to lay down. ???His heart rate was clocking at 184 beats per minute. ???He was near syncopal. ???When he went to the ER, the heart rate was in the 120s. ???He had 2 more episodes while in the ER with the heart rates going up to 150-160 beats per minute. ???Every time they tried to walk him, his heart rate would go up, up to 145 beats per minute or so. ???He was kept overnight and then next morning Dr. Geronimo Boot evaluated him and transferred him to [facility] for further evaluation because of concerns with exertional symptoms. ???While in the hospital, he was found to have orthostatic tachycardia with rates up to 150 beats per minute, foggy vision, fainting sensation, and sweating. ???This was occurring sometimes even with lying down and his hands would go numb. ???He was initiated on beta blockers, initially 12.5 mg and then increased to 25 mg daily. 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 ???He underwent extensive imaging including echocardiogram which was normal, cardiac MRI which was normal. ???He had extensive antiviral testing as well as rheumatoid workup. ???Orthostatic vital signs, as mentioned above, showed heart rate with an increase of 39 beats per minute from lying down to standing and slight increase of normalization of blood pressure.   ???  His symptoms improved with beta blockers and Florinef and therefore, he was asked to come for outpatient followup. ???He also received a psychiatric consultation while in the hospital and they did not make any major changes to his current treatment with BuSpar.   ???  Followup today, he complains of occasional chest tightness as well as sensation in the left hand in the form of tingling. ???He does have winged scapula, which is an older problem from sports injury. ???His symptoms are worse at night but not exertional. ???He gets occasional lightheadedness and occasional headaches. 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   ???  Social History: ???He drinks about 80 ounces of water per day, 1 cup of coffee, and used to drink at least 1 V8 energy drink per day. ???He stopped that since he had these problems. ???His alcohol intake is social.   ???  Family History: ???Dad had arrhythmia, sudden cardiac arrest at age of 24, and then 1 other family member with sudden cardiac death at the age of 22. ???His dad had alcoholism issues. ???Maternal grandmother had coronary disease in her 71s to 24s.   ???  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 ???It was previously complicated by loss of fluid, energy drinks, anxiety disorder, etc. ???I asked him to wean off Florinef and then subsequently continue beta blocker with a plan to get rid of it eventually in the future.   ???  He tells me that he was able to wean off the Florinef, but about a week later or so, he had an admission to [facility] Emergency Room with symptoms of leg spasms, chills and presyncopal episodes. ???His heart rate was in the 90-110 range. ???He felt like he was urinating a lot, and he was felt like he was dehydrated. ???However, he also states that laying down the symptoms were worse rather than sitting up or standing up. ???He was kept overnight, and the atenolol dose was increased before his discharge. ???However, he was also found to have rhabdomyolysis with CK levels at 1390 to 190. ???Calcium was slightly elevated, BUN was slightly elevated. ???He was also told to increase fluid intake.   ???  He saw his primary care physician in followup and he was complaining of new onset of mucousy diarrhea, blood in the stools, etc. ???which may be ongoing for a few weeks before that. ???Therefore, a CT scan of the abdomen was ordered as well as stool analysis was ordered. ???He does have strong family history of bowel disease.   ???  He comes today being somewhat frustrated about his overall health condition not knowing exactly what is going on. ???He definitely has more anxiety whenever he has any symptoms.   ???  When I saw him in 2019, I strongly encouraged him to get a GI workup which could be resulting in significant volume loss which in fact can cause tachycardia.   ???  He was in the process when he was admitted to the hospital with significant problems with tachycardia and abdominal issues.  He was found to have C difficile colitis and orthostatic tachycardia.  He was started on atenolol, IV fluids.  His primary care physician started him on prednisone on January 09, 2018, for colitis after antibiotics did not help.   ???  He has been started on atenolol 37.5 mg daily.   ???  I reviewed his echocardiogram and cardiac MRI results from recent hospitalization, both of which looked normal.   ???  I reviewed his CT abdomen result that was recently performed which looks like there is no evidence of Crohn disease or other inflammatory bowel disorders.     He saw the gastroenterologist and was diagnosed with C diff again, twice since then including last week.  Obviously, it is frustrating because it keeps recurring in spite of therapy.  He is planning to get fecal transplant as a last resort treatment.     He comes today telling us that he is able to work out and be otherwise active except for the C diff issue.  Last few weeks, he has noted some numbness in the legs and the left arm.  He has some diarrhea as well as heaviness in the chest with occasional palpitations.       I reviewed his 12-lead EKG which shows sinus rhythm, rate 57, PR 140, QRS 88, QTc is 365 msec.     His cardiac imaging in October 2019 was normal.     1. Sinus tachycardia, possibly due to hypovolemia in the setting of GI  losses.  He is treated with atenolol because of symptoms only.  2. Anxiety disorder.  He is on treatment.    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  He is considering fecal transplant.  4. Asthma.  5. I tried to reassure him that his symptoms could be secondary to other systemic illnesses going on in his body and that since his cardiovascular imaging was very normal, there is low risk of any cardiac complications from it.  He could also consider evaluation for neuropathy from a neurologist for his symptoms of numbness in the legs.     I will plan on seeing him in 6 months or, if he is doing well, in a year.    ([id number])               Vitals:    07/01/18 1304   BP: 112/72   BP Source: Arm, Left Upper   Pulse: 73   SpO2: 99%   Weight: 68 kg (150 lb)   Height: 1.626 m (5' 4)   PainSc: Zero     Body mass index is 25.75 kg/m???.     Past Medical History  Patient Active Problem List    Diagnosis Date Noted   ??? SOB (shortness of breath) 05/02/2018   ??? Dizziness 05/02/2018   ??? Colitis due to Clostridioides difficile 05/02/2018 ??? Postural orthostatic tachycardia syndrome 10/29/2017     10/24/2017 - ECHO:  Left ventricular systolic function is within normal limits.  LVEF 60%  No significant valvular abnormalities.  No pericardial effusion.   10/26/2017 - Cardiac MRI:  Normal LV systolic function with a calculated ejection fraction of 70%. The LV cavity is at the upper end of normal per the end-diastolic volume index.  ???No definite areas of delayed hyperenhancement to suggest inflammatory or infiltrative process.  The resting perfusion pattern appeared normal. The takeoff of the coronary arteries appeared normal.  RV size and function appeared normal.  There are no significant valvular abnormalities.  The aortic root was normal in size.  The visualized portions of the pulmonary arteries were normal.  There is no pericardial effusion.       ??? Chest pain 10/25/2017   ??? Polyuria 10/24/2017   ??? Arrhythmia 10/24/2017         Review of Systems   Cardiovascular: Positive for chest pain.        Heavy feeling in chest    Neurological: Positive for numbness.        In hands and fingers for one week        Physical Exam  Patient is a moderately well-built gentleman who is comfortable at rest,  not in any distress.  Sclerae anicteric.  The oral mucosa is moist and pink.  Neck is  supple without any lymphadenopathy.  Lungs are clear to auscultation bilaterally.  Breath  sounds are normal.  Cardiac exam reveals normal S1, S2 with regular rate and  rhythm.  No murmurs, rubs or gallops noted.  Abdomen:  Soft, nontender, nondistended.  Bowel sounds are present.  Extremities:  No cyanosis, clubbing or edema.  Peripheral  pulses are symmetric.  Skin without any rash. . No gross motor or neuro deficits.    Cardiovascular Studies      Problems Addressed Today  No diagnosis found.    Assessment and Plan     As above in HPI section.           Current Medications (including today's revisions)  ???  atenoloL (TENORMIN) 25 mg tablet Take 1.5 tablets by mouth daily. (Patient taking differently: Take 37.5 mg by mouth daily. 1 and a half tablet daily)   ??? busPIRone (BUSPAR) 15 mg tablet Take 15 mg by mouth twice daily.   ??? dicyclomine (BENTYL) 20 mg tablet TAKE 1 TABLET BY MOUTH 4 TIMES DAILY AS NEEDED FOR ABDOMINAL CRAMPING

## 2018-07-02 ENCOUNTER — Encounter: Admit: 2018-07-02 | Discharge: 2018-07-02

## 2018-07-02 ENCOUNTER — Ambulatory Visit: Admit: 2018-07-01 | Discharge: 2018-07-02

## 2018-07-02 DIAGNOSIS — I499 Cardiac arrhythmia, unspecified: Secondary | ICD-10-CM

## 2018-07-02 DIAGNOSIS — I498 Other specified cardiac arrhythmias: Secondary | ICD-10-CM

## 2018-07-02 DIAGNOSIS — R0602 Shortness of breath: Secondary | ICD-10-CM

## 2018-07-04 ENCOUNTER — Encounter: Admit: 2018-07-04 | Discharge: 2018-07-04

## 2018-07-04 NOTE — Telephone Encounter
Call placed to pt to obtain more information on symptoms.    Pt reports he began taking vancomycin on 6/25 has had 6 doses at time of call. Yesterday on 6/25 had 7 BM's. Today 6/26 had had 9 BM's at tome of call. BM's are runny and watery. He has also noted mucus in stools again. No blood noted. Pt has been taking bentyl for cramping which has worsened in the last day with increased BM's. Has been feeling lightheaded. Pt also reports that he has noted numbness in hands and legs- discussed this at appt with cardiologist on 6/23. Pt stated he has been trying to increase his fiber intake as previously recommended at [facility].   Discussed with pt the importance hydration. Encouraged good intake of fluids and replacing electrolytes.   Discussed with pt possible symptoms that may warrant a visit to ED over the weekend for evaluation/ IV rehydration.     Pt verbalized understanding with no further questions or concerns at this time.    Routing to Dr. Posey Pronto for any recommendations.

## 2018-07-07 NOTE — Telephone Encounter
Received VM from pt's mother with reports of worsening symptoms.   Call returned.Spoke with pt. Pt reports that he has been struggled with diarrhea and frequency all weekend having at least 10 BM's a day. Pt reports that he feels he has not been able to keep up hydration with frequent BM's. He reports that he lost 4 lbs over the weekend, has continued to feel lightheaded, and now feels very weak. Pt stated his mother has told him that he looks "pale, has no color". Pt expresses concerns about his current symptoms with this RN and that he will probably report to his local ED today.  Discussed with pt that he should report to the ED for immediate evaluation and possibly IV hydration. Pt verbalized understanding with no further questions or concerns at this time.

## 2018-07-09 ENCOUNTER — Encounter: Admit: 2018-07-09 | Discharge: 2018-07-09

## 2018-07-09 DIAGNOSIS — Z1159 Encounter for screening for other viral diseases: Secondary | ICD-10-CM

## 2018-07-09 DIAGNOSIS — A0471 Enterocolitis due to Clostridium difficile, recurrent: Secondary | ICD-10-CM

## 2018-07-09 MED ORDER — FIDAXOMICIN 200 MG PO TAB
200 mg | ORAL_TABLET | Freq: Two times a day (BID) | ORAL | 0 refills | 20.00000 days | Status: AC
Start: 2018-07-09 — End: ?

## 2018-07-09 MED ORDER — PEG-ELECTROLYTE SOLN 420 GRAM PO SOLR
0 refills | Status: DC
Start: 2018-07-09 — End: 2018-09-09

## 2018-07-09 NOTE — Telephone Encounter
Prep for case placed. Prescription sent to patients preferred pharmacy.  Call placed to pt to discuss. Pt verbalized understanding with no further questions or concerns at this time.  MyChart sent to pt with FMT education attachments.

## 2018-07-15 DIAGNOSIS — A0471 Enterocolitis due to Clostridium difficile, recurrent: Principal | ICD-10-CM

## 2018-07-16 ENCOUNTER — Encounter: Admit: 2018-07-16 | Discharge: 2018-07-16

## 2018-07-16 NOTE — Telephone Encounter
Received call from pt inquiring on plan to complete FMT and that he has not gotten a call to schedule.   Case discussed with Dr. Posey Pronto. Pt to have FMT done on 7/22 by Dr. Tessa Lerner..  Call placed to pt. Discussed prep instructions at length. Pt to begin dificid Rx on 7/11 and stop 48 hours prior ro procedure on 7/20. Both bowel prep and dificid have been filled and picked up by pt. Instructed pt to arrive at admitting at 1315 on 7/22. Pt verbalized understanding with no further questions or concerns at this time.    MyChart message to be sent to pt with instructions.

## 2018-07-28 ENCOUNTER — Encounter: Admit: 2018-07-28 | Discharge: 2018-07-29

## 2018-07-28 DIAGNOSIS — Z1159 Encounter for screening for other viral diseases: Secondary | ICD-10-CM

## 2018-07-28 NOTE — Progress Notes
Patient arrived to [place] clinic for COVID-19 testing 07/28/18 1259. Patient identity confirmed via photo I.D. Nasopharyngeal procedure explained to the patient.   Nasopharyngeal swab completed right  Patient education provided given and instructed patient self isolate until contacted w/ results and further instructions.   Swab collected by brett little.    Date symptoms began/reason for testing: preop

## 2018-07-29 ENCOUNTER — Encounter: Admit: 2018-07-29 | Discharge: 2018-07-29

## 2018-07-29 LAB — COVID-19 (SARS-COV-2) PCR

## 2018-07-30 ENCOUNTER — Encounter: Admit: 2018-07-30 | Discharge: 2018-07-30

## 2018-07-30 ENCOUNTER — Ambulatory Visit: Admit: 2018-07-30 | Discharge: 2018-07-30

## 2018-07-30 DIAGNOSIS — I499 Cardiac arrhythmia, unspecified: Secondary | ICD-10-CM

## 2018-07-30 DIAGNOSIS — Z87891 Personal history of nicotine dependence: Secondary | ICD-10-CM

## 2018-07-30 DIAGNOSIS — K529 Noninfective gastroenteritis and colitis, unspecified: Secondary | ICD-10-CM

## 2018-07-30 DIAGNOSIS — A0471 Enterocolitis due to Clostridium difficile, recurrent: Principal | ICD-10-CM

## 2018-07-30 MED ORDER — PROPOFOL 10 MG/ML IV EMUL 20 ML (INFUSION)(AM)(OR)
INTRAVENOUS | 0 refills | Status: DC
Start: 2018-07-30 — End: 2018-07-30
  Administered 2018-07-30: 20:00:00 200 ug/kg/min via INTRAVENOUS

## 2018-07-30 MED ORDER — LOPERAMIDE 2 MG PO CAP
4 mg | Freq: Once | ORAL | 0 refills | Status: CP
Start: 2018-07-30 — End: ?
  Administered 2018-07-30: 21:00:00 4 mg via ORAL

## 2018-07-30 MED ORDER — PROPOFOL INJ 10 MG/ML IV VIAL
0 refills | Status: DC
Start: 2018-07-30 — End: 2018-07-30
  Administered 2018-07-30: 20:00:00 50 mg via INTRAVENOUS
  Administered 2018-07-30: 20:00:00 30 mg via INTRAVENOUS
  Administered 2018-07-30: 20:00:00 20 mg via INTRAVENOUS
  Administered 2018-07-30: 20:00:00 80 mg via INTRAVENOUS
  Administered 2018-07-30: 20:00:00 20 mg via INTRAVENOUS

## 2018-07-30 MED ORDER — (INV) FMT LOWER DELIVERY 250ML RE ENEMA
250 mL | Freq: Once | RECTAL | 0 refills | Status: DC
Start: 2018-07-30 — End: 2018-07-30

## 2018-07-30 MED ORDER — STERILE WATER/SIMETHICONE IRRIGATION
0 refills | Status: DC
Start: 2018-07-30 — End: 2018-07-30
  Administered 2018-07-30: 20:00:00 50 mL

## 2018-07-30 MED ORDER — LACTATED RINGERS IV SOLP
1000 mL | INTRAVENOUS | 0 refills | Status: DC
Start: 2018-07-30 — End: 2018-07-30
  Administered 2018-07-30: 19:00:00 1000 mL via INTRAVENOUS

## 2018-07-30 MED ORDER — (INV) FMT LOWER DELIVERY 250ML RE ENEMA
0 refills | Status: DC
Start: 2018-07-30 — End: 2018-07-30
  Administered 2018-07-30: 20:00:00 250 mL via RECTAL

## 2018-07-30 MED ORDER — LIDOCAINE (PF) 200 MG/10 ML (2 %) IJ SYRG
0 refills | Status: DC
Start: 2018-07-30 — End: 2018-07-30
  Administered 2018-07-30: 20:00:00 100 mg via INTRAVENOUS

## 2018-07-30 NOTE — Anesthesia Pre-Procedure Evaluation
Anesthesia Pre-Procedure Evaluation    Name: Jesse Peters      MRN: [id number]     DOB: Dec 27, 1990     Age: 28 y.o.     Sex: male   _________________________________________________________________________     Procedure Info:   Procedure Information     Date/Time:  07/30/18 1445    Procedure:  PREPARATION FECAL MICROBIOTA FOR INSTILLATION WITH ASSESSMENT DONOR SPECIMEN, administered via colonoscopy (N/A )    Location:  [facility] / ENDO/GI    Surgeon:  Lenor Derrick, MD          Physical Assessment  Vital Signs (last filed in past 24 hours):  BP: 116/59 (07/22 1325)  Temp: 36.9 ???C (98.4 ???F) (07/22 1325)  Pulse: 71 (07/22 1325)  Respirations: 17 PER MINUTE (07/22 1325)  SpO2: 100 % (07/22 1325)  Height: 162.6 cm (64) (07/22 1334)  Weight: 67.1 kg (148 lb) (07/22 1334)      Patient History   Allergies   Allergen Reactions   ??? Strawberry RASH        Current Medications    Medication Directions   atenoloL (TENORMIN) 25 mg tablet Take 1.5 tablets by mouth daily.  Patient taking differently: Take 37.5 mg by mouth daily. 1 and a half tablet daily   busPIRone (BUSPAR) 15 mg tablet Take 15 mg by mouth twice daily.   dicyclomine (BENTYL) 20 mg tablet TAKE 1 TABLET BY MOUTH 4 TIMES DAILY AS NEEDED FOR ABDOMINAL CRAMPING   peg-electrolyte solution (NULYTELY) 420 gram oral solution Split dose by mouth as directed by GI office   vancomycin (VANCOCIN) 125 mg capsule Take one capsule by mouth four times daily for 14 days, THEN one capsule twice daily for 7 days, THEN one capsule daily for 7 days, THEN one capsule every 48 hours for 28 days. Indications: diarrhea from an infection with Clostridium difficile bacteria         Review of Systems/Medical History        PONV Screening: Non-smoker      Airway - negative        Pulmonary           No sleep apnea (no diagnosis but snores, sometimes wakes feeling short of breath)      Cardiovascular       Recent diagnostic studies:          echocardiogram          Echo 2019 Left ventricular systolic function is within normal limits.  LVEF 60%  No significant valvular abnormalities.  No pericardial effusion.       Exercise tolerance: >4 METS      Beta Blocker therapy: No      Beta blockers within 24 hours: n/a      Dysrhythmias (Tachycardia)      GI/Hepatic/Renal         Recurrent C diff      Neuro/Psych           Psychiatric history          Anxiety      Musculoskeletal - negative        Endocrine/Other - negative      Constitution - negative   Physical Exam    Airway Findings      Mallampati: I      TM distance: >3 FB      Neck ROM: full      Mouth opening: good      Airway patency: adequate  Dental Findings: Negative      Cardiovascular Findings:       Rhythm: regular      Rate: normal    Pulmonary Findings:       Breath sounds clear to auscultation.    Abdominal Findings:       Not obese    Neurological Findings:       Alert and oriented x 3    Constitutional findings:       No acute distress       Diagnostic Tests  Hematology:   Lab Results   Component Value Date    HGB 14.7 04/07/2018    HCT 44.8 04/07/2018    PLTCT 259 04/07/2018    WBC 8.0 04/07/2018    NEUT 84.3 04/07/2018    ANC 6.7 04/07/2018    ALC 0.8 04/07/2018    MONA 5.6 04/07/2018    AMC 0.4 04/07/2018    EOSA 4 01/06/2018    ABC 0.0 04/07/2018    MCV 98.3 04/07/2018    MCH 32.2 04/07/2018    MCHC 32.7 04/07/2018    MPV 8.1 01/06/2018    RDW 12.5 04/07/2018         General Chemistry:   Lab Results   Component Value Date    NA 141 04/07/2018    K 4.8 04/07/2018    CL 104 04/07/2018    CO2 27.0 04/07/2018    GAP 15 04/07/2018    BUN 14.0 04/07/2018    CR 0.92 04/07/2018    GLU 123 04/07/2018    CA 9.9 04/07/2018    ALBUMIN 4.3 04/07/2018    MG 1.8 10/28/2017    TOTBILI 0.30 04/07/2018      Coagulation: No results found for: PT, PTT, INR      Anesthesia Plan    ASA score: 2   Plan: MAC  Induction method: intravenous  NPO status: acceptable      Informed Consent  Anesthetic plan and risks discussed with patient. Plan discussed with: anesthesiologist and CRNA.

## 2018-07-30 NOTE — H&P (View-Only)
Pre Procedure History and Physical/Sedation Plan    Name:Jesse Peters                                                                   MRN: [id number]                 DOB:May 15, 1990          Age: 28 y.o.  Date of Service: 07/30/2018    Date of Procedure:  07/30/2018    Planned Procedure(s):  GI:  Colonoscopy and fecal transplant  Sedation/Medication Plan: MAC (Monitored Anesthesia Care)  Discussion/Reviews:  Physician has discussed risks and alternatives of this type of sedation and above planned procedures with patient  ___________________________________________________________________  Chief Complaint:  Recurrent c diff    History of Present Illness: Jesse Peters is a 28 y.o. male as above      Medical History:   Diagnosis Date   ??? Arrhythmia      History reviewed. No pertinent surgical history.  Pertinent medical/surgical history reviewed  Pertinent family history reviewed  Social History     Tobacco Use   ??? Smoking status: Never Smoker   ??? Smokeless tobacco: Former Neurosurgeon     Types: Chew   Substance Use Topics   ??? Alcohol use: Yes     Comment: Minimal   ??? Drug use: Not Currently     Social History     Substance and Sexual Activity   Drug Use Not Currently     Allergies:  Strawberry  Medications  Current Facility-Administered Medications   Medication   ??? (INV) FMT lower delivery enema 250 mL     Review of Systems:  All other systems reviewed and are negative.           Physical Exam:  Temp: 36.9 ???C (98.4 ???F) (07/22 1325)  Pulse: 71 (07/22 1325)  Respirations: 17 PER MINUTE (07/22 1325)  BP: 116/59 (07/22 1325)      General:  Not in acute distress  Lungs:  breathing comfortably.  Chest wall:  No tenderness or deformity.  Heart:   Regular rate and rhythm  Abdomen:  soft, non distended,non tender.  Extremities: No edema    Airway:  per anesthesia  Anesthesia Classification:  Per Anesthesia  NPO Status: Acceptable  Pregnancy Status: N/A    Lab/Radiology/Other Diagnostic Tests Labs:  24-hour labs:  No results found for this visit on 07/30/18 (from the past 24 hour(s)).      Lenor Derrick, MD  Pager

## 2018-07-30 NOTE — Discharge Planning (AHS/AVS)
Colon/Lower EUS/Retrograde Enteroscopy     Post Lower Endoscopy Instructions    -If you feel feverish, have a temperature of 101 degrees or higher, persistent nausea and vomiting, abdominal pain or dark stools; please notify your nurse or GI physician.    -You may have abdominal cramping following the procedure this can be relieved by belching or passing air.    -If you have redness or swelling at the IV site, place a warm, wet washcloth over the affected areas for 15 minutes, 3-4 times a day until the redness subsides.  If symptoms continue for 2-3 days, contact your regular physician.    - If you have bleeding from your bowels over 2 tablespoons and increasing, please notify your physician.  A small amount of bleeding is normal if a biopsy or polyps were taken.    - You may resume all your routine medications, if medications need to be held your physician and/or nurse will notify you post procedure.    SPECIFIC INSTRUCTIONs    OUTPATIENTS:  A. Because of sedation and lack of coordination, FOR THE NEXT 24 HOURS, DO NOT:  1. Operate any motorized vehicle - this includes driving.  2. Sign any legal documents or conduct important business matters.  3. Use any dangerous machinery (chain saw, lawnmower, etc.).  4. Drink any alcoholic beverages.  Should you have any questions or concerns after your procedure please call [phone] M-F 8am-5:00 pm. After 5:00 pm, holidays or weekends call [phone] and ask for the GI Doctor on call.

## 2018-07-30 NOTE — Anesthesia Post-Procedure Evaluation
Post-Anesthesia Evaluation    Name: Jesse Peters      MRN: [id number]     DOB: 1990-05-30     Age: 28 y.o.     Sex: male   __________________________________________________________________________     Procedure Information     Anesthesia Start Date/Time:  07/30/18 1512    Procedures:       PREPARATION FECAL MICROBIOTA FOR INSTILLATION WITH ASSESSMENT DONOR SPECIMEN, administered via colonoscopy (N/A )      COLONOSCOPY VIA STOMA WITH BIOPSY    Location:  [facility] / ENDO/GI    Surgeon:  Marrianne Mood, MD          Post-Anesthesia Vitals  BP: 112/67 (07/22 1616)   Vitals Value Taken Time   BP 112/67 07/30/2018  4:16 PM   Temp 36.5 C (97.7 F) 07/30/2018  3:38 PM   Pulse 70 07/30/2018  3:55 PM   Respirations 20 PER MINUTE 07/30/2018  3:55 PM   SpO2 100 % 07/30/2018  3:55 PM         Post Anesthesia Evaluation Note    Evaluation location: pre/post  Patient participation: recovered; patient participated in evaluation  Level of consciousness: alert  Pain management: adequate    Hydration: normovolemia  Temperature: 36.0C - 38.4C  Airway patency: adequate    Perioperative Events      Postoperative Status  Cardiovascular status: hemodynamically stable  Respiratory status: spontaneous ventilation        Perioperative Events  Perioperative Event: No

## 2018-07-31 ENCOUNTER — Encounter: Admit: 2018-07-31 | Discharge: 2018-07-31

## 2018-07-31 DIAGNOSIS — I499 Cardiac arrhythmia, unspecified: Secondary | ICD-10-CM

## 2018-08-01 ENCOUNTER — Encounter: Admit: 2018-08-01 | Discharge: 2018-08-01

## 2018-08-07 ENCOUNTER — Encounter: Admit: 2018-08-07 | Discharge: 2018-08-07

## 2018-08-07 NOTE — Telephone Encounter
Discussed colon biopsy results with Lovena Le.  Chronic changes of colitis could be from C diff vs underlying IBD.  He did not have endoscopic appearance of IBD during the FMT.      Recommend follow up as scheduled in 8 weeks.  If symptoms persist, we will perform flex sig to obtain additional biopsies at that time and consider EGD for ongoing bloating.  He is agreeable with plan.    He does report night time awakenings where he feels short of breath and is gasping for air.  I recommend him to follow up with PCP for evaluation of sleep apnea and other causes.  This does not seem to be GI related.  He reports understanding and will contact PCP.

## 2018-08-08 ENCOUNTER — Encounter: Admit: 2018-08-08 | Discharge: 2018-08-08

## 2018-08-12 NOTE — Telephone Encounter
Received VM from pt with reports of diarrhea and dysphagia today(8/4) with some fatigue.  Call returned to pt. Pt reports 5 BM's today described loose and unformed. Still having cramping. Has conistent nausea and bloating. Has a lot of gas and bloating about 30 minutes after eating. Reports abdominal distention. Eating or not eating has no effect on the nausea.   Dysphagia- feels like his esophagus closes when he tries to swallow both liquids and solids. Pt states it is not as bad with liquids.  Pt is concerned unsure of how to proceed at this point. 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     Discussed with pt signs and symptoms that would warrant a ED visit (dehydration, food bolus, etc)  Pt verbalized understanding with no further questions or concerns at this time.    Routing update to Dr. Posey Pronto for recommendations.

## 2018-08-14 ENCOUNTER — Encounter: Admit: 2018-08-14 | Discharge: 2018-08-14

## 2018-08-14 DIAGNOSIS — Z1159 Encounter for screening for other viral diseases: Secondary | ICD-10-CM

## 2018-08-14 DIAGNOSIS — K529 Noninfective gastroenteritis and colitis, unspecified: Secondary | ICD-10-CM

## 2018-08-14 DIAGNOSIS — R1310 Dysphagia, unspecified: Secondary | ICD-10-CM

## 2018-08-14 DIAGNOSIS — R140 Abdominal distension (gaseous): Secondary | ICD-10-CM

## 2018-08-15 DIAGNOSIS — R1310 Dysphagia, unspecified: Secondary | ICD-10-CM

## 2018-08-15 DIAGNOSIS — R140 Abdominal distension (gaseous): Secondary | ICD-10-CM

## 2018-08-15 DIAGNOSIS — K529 Noninfective gastroenteritis and colitis, unspecified: Secondary | ICD-10-CM

## 2018-08-15 NOTE — Telephone Encounter
Call placed to pt to discuss recs. Pt agreeable to procedure and tentative date of 9/. Pt verbalized understanding with no further questions or concerns at this time.

## 2018-09-04 ENCOUNTER — Encounter: Admit: 2018-09-04 | Discharge: 2018-09-04

## 2018-09-07 DIAGNOSIS — Z01818 Encounter for other preprocedural examination: Secondary | ICD-10-CM

## 2018-09-07 NOTE — Progress Notes
Patient arrived to [facility] for COVID-19 testing 09/07/18 1358. Patient identity confirmed via photo I.D. Nasopharyngeal procedure explained to the patient.   Nasopharyngeal swab completed right  Patient education provided given and instructed patient self isolate until contacted w/ results and further instructions. CDC handout on COVID-19 given to patient.   [email or web address]    Swab collected by Myrtie Soman, CM.    Date symptoms began/reason for testing: Pre procedure

## 2018-09-08 ENCOUNTER — Encounter: Admit: 2018-09-07 | Discharge: 2018-09-08

## 2018-09-08 ENCOUNTER — Encounter: Admit: 2018-09-08 | Discharge: 2018-09-08

## 2018-09-08 DIAGNOSIS — Z1159 Encounter for screening for other viral diseases: Secondary | ICD-10-CM

## 2018-09-08 LAB — COVID-19 (SARS-COV-2) PCR

## 2018-09-09 ENCOUNTER — Ambulatory Visit: Admit: 2018-09-09 | Discharge: 2018-09-09

## 2018-09-09 ENCOUNTER — Encounter: Admit: 2018-09-09 | Discharge: 2018-09-09

## 2018-09-09 DIAGNOSIS — R1310 Dysphagia, unspecified: Principal | ICD-10-CM

## 2018-09-09 DIAGNOSIS — I499 Cardiac arrhythmia, unspecified: Secondary | ICD-10-CM

## 2018-09-09 DIAGNOSIS — K5190 Ulcerative colitis, unspecified, without complications: Secondary | ICD-10-CM

## 2018-09-09 DIAGNOSIS — K449 Diaphragmatic hernia without obstruction or gangrene: Secondary | ICD-10-CM

## 2018-09-09 MED ORDER — LACTATED RINGERS IV SOLP
0 refills | Status: DC
Start: 2018-09-09 — End: 2018-09-09
  Administered 2018-09-09: 15:00:00 via INTRAVENOUS

## 2018-09-09 MED ORDER — PROPOFOL INJ 10 MG/ML IV VIAL
0 refills | Status: DC
Start: 2018-09-09 — End: 2018-09-09
  Administered 2018-09-09 (×2): 50 mg via INTRAVENOUS
  Administered 2018-09-09: 15:00:00 100 mg via INTRAVENOUS
  Administered 2018-09-09 (×2): 50 mg via INTRAVENOUS
  Administered 2018-09-09: 15:00:00 100 mg via INTRAVENOUS
  Administered 2018-09-09 (×4): 50 mg via INTRAVENOUS

## 2018-09-09 MED ORDER — KETAMINE 10 MG/ML IJ SOLN
0 refills | Status: DC
Start: 2018-09-09 — End: 2018-09-09
  Administered 2018-09-09: 15:00:00 30 mg via INTRAVENOUS

## 2018-09-09 MED ORDER — LACTATED RINGERS IV SOLP
Freq: Once | INTRAVENOUS | 0 refills | Status: CP
Start: 2018-09-09 — End: ?
  Administered 2018-09-09: 14:00:00 1000.000 mL via INTRAVENOUS

## 2018-09-09 NOTE — Discharge Planning (AHS/AVS)
EGD  Post Upper Endoscopy Instructions    -You may have a sore throat after the procedure for 2-3 days.  Try sucrets or lozenges to help ease the pain.  If it continues please contact us.    -If you feel feverish, have a temperature of 101 degrees or higher, persistent nausea and vomiting, abdominal pain or dark stools; please notify your nurse or GI physician.    -You may have abdominal cramping following the procedure this can be relieved by belching or passing air.    -If you have redness or swelling at the IV site, place a warm, wet washcloth over the affected areas for 15 minutes, 3-4 times a day until the redness subsides.  If symptoms continue for 2-3 days, contact your regular physician.    - If you have bleeding from your mouth, over 2 tablespoons and increasing, please notify your physician.  A small amount of bleeding is normal if a biopsy or polyps were taken.  If you are vomiting blood you need to seek immediate medical attention.    - You may resume all your routine medications, if medications need to be held your physician and/or nurse will notify you post procedure.      Colon  Post Lower Endoscopy Instructions    -If you feel feverish, have a temperature of 101 degrees or higher, persistent nausea and vomiting, abdominal pain or dark stools; please notify your nurse or GI physician.    -You may have abdominal cramping following the procedure this can be relieved by belching or passing air.    -If you have redness or swelling at the IV site, place a warm, wet washcloth over the affected areas for 15 minutes, 3-4 times a day until the redness subsides.  If symptoms continue for 2-3 days, contact your regular physician.    - If you have bleeding from your bowels over 2 tablespoons and increasing, please notify your physician.  A small amount of bleeding is normal if a biopsy or polyps were taken.    - You may resume all your routine medications, if medications need to be held your physician and/or nurse will notify you post procedure.    SPECIFIC INSTRUCTIONS    OUTPATIENTS:  A. Because of sedation and lack of coordination, FOR THE NEXT 24 HOURS, DO NOT:  1. Operate any motorized vehicle - this includes driving.  2. Sign any legal documents or conduct important business matters.  3. Use any dangerous machinery (chain saw, lawnmower, etc.).  4. Drink any alcoholic beverages.  Should you have any questions or concerns after your procedure please call [phone] M-F 8am-5:00 pm. After 5:00 pm, holidays or weekends call [phone] and ask for the GI Doctor on call.

## 2018-09-09 NOTE — Anesthesia Post-Procedure Evaluation
Post-Anesthesia Evaluation    Name: Jesse Peters      MRN: [id number]     DOB: [birth year]     Age: 28 y.o.     Sex: male   __________________________________________________________________________     Procedure Information     Anesthesia Start Date/Time:  09/09/18 1000    Procedures:       ESOPHAGOGASTRODUODENOSCOPY WITH BIOPSY - FLEXIBLE (N/A )      COLONOSCOPY WITH BIOPSY - FLEXIBLE    Location:  [facility] / ENDO/GI    Surgeon:  Reggy Eye, MD          Post-Anesthesia Vitals  BP: 70/44 (09/01 1045)  Temp: 36.4 C (97.5 F) (09/01 1039)  Pulse: 71 (09/01 1045)  Respirations: 16 PER MINUTE (09/01 1045)  SpO2: 96 % (09/01 1045)  SpO2 Pulse: 71 (09/01 1045)   Vitals Value Taken Time   BP 70/44 09/09/2018 10:45 AM   Temp 36.4 C (97.5 F) 09/09/2018 10:39 AM   Pulse 71 09/09/2018 10:45 AM   Respirations 16 PER MINUTE 09/09/2018 10:45 AM   SpO2 96 % 09/09/2018 10:45 AM         Post Anesthesia Evaluation Note    Evaluation location: pre/post  Patient participation: recovered; patient participated in evaluation  Level of consciousness: alert    Pain score: 0  Pain management: adequate    Hydration: normovolemia  Temperature: 36.0C - 38.4C  Airway patency: adequate    Perioperative Events      Postoperative Status  Cardiovascular status: hemodynamically stable  Respiratory status: spontaneous ventilation  Follow-up needed: none        Perioperative Events  Perioperative Event: No  Emergency Case Activation: No

## 2018-09-09 NOTE — H&P (View-Only)
Pre Procedure History and Physical/Sedation Plan    Name:Jesse Peters                                                                   MRN: [id number]                 DOB:1990/12/03          Age: 28 y.o.  Date of Service: 09/09/2018 9:57 AM      Date of Procedure:  09/09/2018    Planned Procedure(s):  GI:  EGD and flex sig, possible esophageal dilation  Sedation/Medication Plan: MAC (Monitored Anesthesia Care)  Discussion/Reviews:  Physician has discussed risks and alternatives of this type of sedation and above planned procedures with patient  ___________________________________________________________________  Chief Complaint:  Dysphagia, bloating, recent food bolus impaction, concern for possible colitis    History of Present Illness: Jesse Peters is a 28 y.o. male with above.  Recently treated with FMT for refractory c diff.  Now with ongoing diarrhea.  Had food bolus impaction last week that eventually spontaneously resolved, went to ER the following day for a constellation of symptoms including dizziness, shortness of breath and labs were normal.  No prior EGD.    Previous Anesthetic/Sedation History:  reviewed    Medical History:   Diagnosis Date   ??? Arrhythmia      Surgical History:   Procedure Laterality Date   ??? PREPARATION FECAL MICROBIOTA FOR INSTILLATION WITH ASSESSMENT DONOR SPECIMEN, administered via colonoscopy N/A 07/30/2018    Performed by Lenor Derrick, MD at [facility] ENDO   ??? COLONOSCOPY VIA STOMA WITH BIOPSY  07/30/2018    Performed by Lenor Derrick, MD at [facility] ENDO     Pertinent medical/surgical history reviewed  Pertinent family history reviewed  Social History     Tobacco Use   ??? Smoking status: Never Smoker   ??? Smokeless tobacco: Former Neurosurgeon     Types: Chew   Substance Use Topics   ??? Alcohol use: Yes     Comment: Minimal   ??? Drug use: Not Currently     Social History     Substance and Sexual Activity   Drug Use Not Currently     Allergies:  Strawberry  Medications No current facility-administered medications for this encounter.      Review of Systems:  All other systems reviewed and are negative.           Physical Exam:  Temp: 36.7 ???C (98.1 ???F) (09/01 2595)  Pulse: 63 (09/01 0838)  Respirations: 10 PER MINUTE (09/01 0838)  BP: 115/64 (09/01 6387)  General appearance: alert, well-developed and well-nourished  Throat: Lips, mucosa, and tongue normal. Teeth and gums normal  Lungs: clear to auscultation bilaterally  Heart: regular rate and rhythm, S1, S2 normal, no murmur, click, rub or gallop  Abdomen: soft, non-tender. Bowel sounds normal. No masses,  no organomegaly  Extremities: extremities normal, atraumatic, no cyanosis or edema  @  Airway:  airway assessment performed  Mallampati I (soft palate, uvula, fauces, tonsillar pillars visible)  Anesthesia Classification:  Per Anesthesia  NPO Status: Acceptable  Pregnancy Status: N/A    Lab/Radiology/Other Diagnostic Tests  Labs:  Relevant labs reviewed      Samuel Jester, MD  Pager

## 2018-09-09 NOTE — Anesthesia Pre-Procedure Evaluation
Anesthesia Pre-Procedure Evaluation    Name: Jesse Peters      MRN: [id number]     DOB: 1990-03-13     Age: 28 y.o.     Sex: male   _________________________________________________________________________     Procedure Info:   Procedure Information     Date/Time:  09/09/18 0955    Procedures:       flexible sigmoidoscopy (N/A )      ESOPHAGOGASTRODUODENOSCOPY WITH BIOPSY - FLEXIBLE (N/A )    Location:  ENDO 5 / ENDO/GI    Surgeon:  Samuel Jester, MD          Physical Assessment  Vital Signs (last filed in past 24 hours):         Patient History   Allergies   Allergen Reactions   ??? Strawberry RASH        Current Medications    Medication Directions   atenoloL (TENORMIN) 25 mg tablet Take 1.5 tablets by mouth daily.  Patient taking differently: Take 37.5 mg by mouth daily. 1 and a half tablet daily   busPIRone (BUSPAR) 15 mg tablet Take 15 mg by mouth twice daily.   dicyclomine (BENTYL) 20 mg tablet TAKE 1 TABLET BY MOUTH 4 TIMES DAILY AS NEEDED FOR ABDOMINAL CRAMPING   peg-electrolyte solution (NULYTELY) 420 gram oral solution Split dose by mouth as directed by GI office         Review of Systems/Medical History      Patient summary reviewed  Nursing notes reviewed  Pertinent labs reviewed    PONV Screening: Non-smoker  No history of anesthetic complications  No family history of anesthetic complications      Airway - negative        Pulmonary           No sleep apnea (no diagnosis but snores, sometimes wakes feeling short of breath)      Cardiovascular       Recent diagnostic studies:          echocardiogram          Echo 2019  Left ventricular systolic function is within normal limits.  LVEF 60%  No significant valvular abnormalities.  No pericardial effusion.       Exercise tolerance: >4 METS      Beta Blocker therapy: No      Beta blockers within 24 hours: n/a      Dysrhythmias (Tachycardia)      GI/Hepatic/Renal       Inflammatory bowel disease      Recurrent C diff      Neuro/Psych Psychiatric history          Anxiety      Musculoskeletal - negative        Endocrine/Other - negative      Constitution - negative   Physical Exam    Airway Findings      Mallampati: I      TM distance: >3 FB      Neck ROM: full      Mouth opening: good      Airway patency: adequate    Dental Findings: Negative      Cardiovascular Findings:       Rhythm: regular      Rate: normal    Pulmonary Findings:       Breath sounds clear to auscultation.    Abdominal Findings:       Not obese    Neurological Findings:  Alert and oriented x 3    Constitutional findings:       No acute distress       Diagnostic Tests  Hematology:   Lab Results   Component Value Date    HGB 14.7 04/07/2018    HCT 44.8 04/07/2018    PLTCT 259 04/07/2018    WBC 8.0 04/07/2018    NEUT 84.3 04/07/2018    ANC 6.7 04/07/2018    ALC 0.8 04/07/2018    MONA 5.6 04/07/2018    AMC 0.4 04/07/2018    EOSA 4 01/06/2018    ABC 0.0 04/07/2018    MCV 98.3 04/07/2018    MCH 32.2 04/07/2018    MCHC 32.7 04/07/2018    MPV 8.1 01/06/2018    RDW 12.5 04/07/2018         General Chemistry:   Lab Results   Component Value Date    NA 141 04/07/2018    K 4.8 04/07/2018    CL 104 04/07/2018    CO2 27.0 04/07/2018    GAP 15 04/07/2018    BUN 14.0 04/07/2018    CR 0.92 04/07/2018    GLU 123 04/07/2018    CA 9.9 04/07/2018    ALBUMIN 4.3 04/07/2018    MG 1.8 10/28/2017    TOTBILI 0.30 04/07/2018      Coagulation: No results found for: PT, PTT, INR      Anesthesia Plan    ASA score: 2   Plan: MAC  Induction method: intravenous  NPO status: acceptable      Informed Consent  Anesthetic plan and risks discussed with patient.        Plan discussed with: CRNA, surgeon/proceduralist and anesthesiologist.

## 2018-09-10 ENCOUNTER — Encounter: Admit: 2018-09-10 | Discharge: 2018-09-10

## 2018-09-10 DIAGNOSIS — I499 Cardiac arrhythmia, unspecified: Secondary | ICD-10-CM

## 2018-09-11 ENCOUNTER — Encounter: Admit: 2018-09-11 | Discharge: 2018-09-11

## 2018-09-11 NOTE — Telephone Encounter
Call placed to pt to assist pt with scheduling appt with IBD team (Dr. Tessa Lerner). See below. New diagnosis Ulcerative Colitis.  Discussed letter/results with pt. Assisted pt with scheduling with Dr Tessa Lerner. Pt preferred telehealth appt at this time due to his work schedule. Pt Scheduled for 9/8 for telehealth appt with Dr. Tessa Lerner. Discussed telehealth appt process with pt again. Discussed with pt that Dr. Tessa Lerner will provide pt with an overview of his new diagnosis and discuss treatment options with pt. Pt verbalized understanding with no further questions or concerns at this time.

## 2018-09-16 ENCOUNTER — Encounter: Admit: 2018-09-16 | Discharge: 2018-09-16

## 2018-09-16 ENCOUNTER — Ambulatory Visit: Admit: 2018-09-16 | Discharge: 2018-09-16

## 2018-09-16 DIAGNOSIS — A0471 Enterocolitis due to Clostridium difficile, recurrent: Secondary | ICD-10-CM

## 2018-09-16 DIAGNOSIS — Z9289 Personal history of other medical treatment: Secondary | ICD-10-CM

## 2018-09-16 DIAGNOSIS — K5100 Ulcerative (chronic) pancolitis without complications: Secondary | ICD-10-CM

## 2018-09-16 DIAGNOSIS — D721 Eosinophilia: Secondary | ICD-10-CM

## 2018-09-16 DIAGNOSIS — I499 Cardiac arrhythmia, unspecified: Secondary | ICD-10-CM

## 2018-09-16 MED ORDER — MESALAMINE 1.2 GRAM PO TBEC
4.8 g | ORAL_TABLET | Freq: Every day | ORAL | 5 refills | 30.00000 days | Status: DC
Start: 2018-09-16 — End: 2018-11-25

## 2018-09-16 NOTE — Progress Notes
Telehealth Visit Note    Date of Service: 09/16/2018    Subjective:      Obtained patient's verbal consent to treat them and their agreement to [facility] financial policy and NPP via this telehealth visit during the [facility]       Jesse Peters is a 28 y.o. male.    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 Allena Katz after his colonoscopy and pathology x2 were suggestive of inflammatory bowel disease     Patient states that he started having GI symptoms in October 2019, mainly with mucousy stools with some abdominal cramps.  He only saw blood about 3 times when he was admitted to [facility] on December/05/2017 with bloody diarrhea and abdominal pain, treated symptomatically then he was a started on prednisone, ciprofloxacin and Flagyl by his PCP in January 2020.  Patient states that prednisone did help a little bit specifically in his lethargy and fatigue symptoms but also help with his bloody diarrhea.  Patient/2020 tested positive for C. difficile on 04/07/2018.  He was initially treated with a course of PO vancomycin, however, he continued to have unchanged diarrhea and repeat testing on 05/21/18 showed persistent C diff.  He was then treated with a 10 day course of fidaxomicin.  This decreased the number of his stools from 5???6/day to 3 a day but still mushy with occasional blood.  Most recent positive C. difficile was on 06/24/2018 and patient eventually required F MT on 07/30/2018.  Colonoscopy performed for FMT showed normal ileum and colon.  However random colon biopsies showed chronic active colitis with cryptitis, crypt abscesses negative for viral inclusions and dysplasia.  Patient continued to be symptomatic, having 3???5 bowel movements/day, occasionally bloody about once a week, associated with diffuse abdominal pain, stabbing, 7/10 in severity, without radiation, without precipitating or aggravating factors, with weight loss of 6 pounds but no melena, fevers, chills, or other GI symptoms.  Repeated colonoscopy 09/09/2018 showed altered vascular pattern with mild inflamed mucosa in the transverse colon, and granular mucosa in the rectum otherwise normal.  Small bowel also looked normal.  Biopsies from the TI were normal, biopsies from the transverse colon and rectum showed active colitis with cryptitis and crypt abscesses.  Biopsies from the rest of the colon were normal.  Patient also had EGD the same day 9/1 that showed normal esophagus dilated up to 18 mm, small hiatal hernia, otherwise normal.    Today to establish care abdominal pain, diarrhea, and weight loss.        Medical History:   Diagnosis Date   ??? Arrhythmia        Surgical History:   Procedure Laterality Date   ??? PREPARATION FECAL MICROBIOTA FOR INSTILLATION WITH ASSESSMENT DONOR SPECIMEN, administered via colonoscopy N/A 07/30/2018    Performed by Lenor Derrick, MD at [facility]   ??? COLONOSCOPY VIA STOMA WITH BIOPSY  07/30/2018    Performed by Lenor Derrick, MD at [facility] ENDO   ??? ESOPHAGOGASTRODUODENOSCOPY WITH BIOPSY - FLEXIBLE N/A 09/09/2018    Performed by Samuel Jester, MD at [facility] ENDO   ??? COLONOSCOPY WITH BIOPSY - FLEXIBLE  09/09/2018    Performed by Samuel Jester, MD at [facility] ENDO     Social History     Socioeconomic  History   ??? Marital status: Married     Spouse name: Not on file   ??? Number of children: Not on file   ??? Years of education: Not on file   ??? Highest education level: Not on file   Occupational History   ??? Occupation: Runner, broadcasting/film/video   Tobacco Use   ??? Smoking status: Never Smoker   ??? Smokeless tobacco: Former Neurosurgeon     Types: Chew   Substance and Sexual Activity   ??? Alcohol use: Yes     Comment: Minimal   ??? Drug use: Not Currently   ??? Sexual activity: Not on file   Other Topics Concern ??? Not on file   Social History Narrative   ??? Not on file       Family History   Problem Relation Age of Onset   ??? Arrhythmia Father    ??? Heart Attack Maternal Grandmother         40s   ??? Crohn's Disease Maternal Grandmother    ??? Heart Disease Maternal Grandmother    ??? Heart Attack Paternal Grandfather 3   ??? Autoimmune Disease Mother         Myasthenia gravis            Review of Systems   Constitutional: Positive for fatigue.   Gastrointestinal: Positive for abdominal pain and nausea.   Genitourinary: Positive for urgency.   Musculoskeletal: Positive for arthralgias and back pain.   Allergic/Immunologic: Positive for environmental allergies.   Neurological: Positive for light-headedness.   Psychiatric/Behavioral: Positive for confusion.   All other systems reviewed and are negative.        Objective:         ??? atenoloL (TENORMIN) 25 mg tablet Take 1.5 tablets by mouth daily. (Patient taking differently: Take 37.5 mg by mouth daily. 1 and a half tablet daily)   ??? busPIRone (BUSPAR) 15 mg tablet Take 15 mg by mouth twice daily.   ??? dicyclomine (BENTYL) 20 mg tablet TAKE 1 TABLET BY MOUTH 4 TIMES DAILY AS NEEDED FOR ABDOMINAL CRAMPING     There were no vitals filed for this visit.  There is no height or weight on file to calculate BMI.     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 Allena Katz after his colonoscopy and pathology x2 were suggestive of inflammatory bowel disease     #Recurrent C. difficile infection: -First diagnosed 04/07/2018, treated with a course of p.o. vancomycin with no response  -Tested positive again for C. difficile on 05/19/2018.  He was then treated with a 10 day course of fidaxomicin.  This decreased the number of  his stools from 5???6/day to 3 a day but still mushy with occasional blood.  -Most recent positive C. difficile was on 06/24/2018 and patient eventually required F MT on 07/30/2018.   -Continue to be symptomatic with mushy diarrhea, occasional bloody.  However this could be related to underlying inflammatory bowel disease    #Inflammatory bowel disease:  -Patient developed GI symptoms of mucousy stools with occasional blood starting October 2019   -Was put on prednisone, Cipro and Flagyl by his PCP in January 2020  -Colonoscopy  07/30/2018 showed normal ileum and colon.  However random colon biopsies showed chronic active colitis with cryptitis, crypt abscesses negative for viral inclusions and dysplasia.   - Repeated colonoscopy 09/09/2018 showed altered vascular pattern with mild inflamed mucosa in the transverse colon, and granular mucosa in the rectum otherwise normal.  Small bowel also looked normal.  Biopsies from the TI were normal, biopsies from the transverse colon and rectum showed active colitis with cryptitis and crypt abscesses.  Biopsies from the rest of the colon were normal.    - EGD the same day 9/1 that showed normal esophagus dilated up to 18 mm, small hiatal hernia, otherwise normal.  -Family history of severe complicated Crohn's disease in  his maternal grandmother  - Most recent labs from 08/2018 showed mild anemia and increased eosinophils    #POTS: Follows with cardiology    Plan:  ??? Patient is currently still having abdominal pain with persistent diarrhea, occasional bloody with endoscopic findings of inflammation in the transverse colon and some granularity in the rectum, with histologic evidence of chronic active colitis.  Patient was treated with prednisone in January 2020 that could have masked the endoscopic findings of ulcerative colitis.  Discussed these findings with the patient and his family and that this most likely represents underlying inflammatory bowel disease, favors ulcerative colitis since TI has been normal and also CT enterography in 04/25/2018 did not show any small bowel disease  ??? However discussed that prednisone patient received in January this year could have masked the picture and and histologic findings that Crohn's disease is also a possibility, however less likely  ??? Given the endoscopic and histologic findings, together with persistent symptoms we will treat patient starting with mesalamine 4.8 g daily.  Also continue on Bentyl for symptomatic management of his abdominal cramps  ??? We will plan to see patient again in clinic in about 4 weeks to reassess his symptoms then if he responds nicely to mesalamine we will consider to repeat colonoscopy in probably about 3 months   ??? Discussed extraintestinal manifestations including dermatologic, ophthalmology, and rheumatologic extraintestinal manifestations with the patient and his family  ??? Also discussed the treatment options for IBD with the patient including mesalamine, Biologics and immunomodulators  ??? Avoid NSAIDs strictly  ??? Avoid smoking  ??? Follow-up accordingly    Return to clinic in 4 weeks        [facility]  Gastroenterology attending  Pager: [phone]      45 minutes spent on this patient's encounter with counseling and coordination of care taking >50% of the visit.

## 2018-10-02 ENCOUNTER — Encounter: Admit: 2018-10-02 | Discharge: 2018-10-02 | Payer: Private Health Insurance - Indemnity

## 2018-10-21 ENCOUNTER — Encounter: Admit: 2018-10-21 | Discharge: 2018-10-21 | Payer: Private Health Insurance - Indemnity

## 2018-10-21 ENCOUNTER — Ambulatory Visit: Admit: 2018-10-21 | Discharge: 2018-10-21 | Payer: Private Health Insurance - Indemnity

## 2018-10-21 DIAGNOSIS — K5190 Ulcerative colitis, unspecified, without complications: Secondary | ICD-10-CM

## 2018-10-21 DIAGNOSIS — R1310 Dysphagia, unspecified: Secondary | ICD-10-CM

## 2018-10-21 DIAGNOSIS — I499 Cardiac arrhythmia, unspecified: Secondary | ICD-10-CM

## 2018-10-21 NOTE — Progress Notes
Date of Service: 10/21/2018    Subjective:             Jesse Peters is a 28 y.o. male.    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 Allena Katz after his colonoscopy and pathology x2 were suggestive of inflammatory bowel disease.  Patient was initially seen in our clinic via Zoom on 09/16/2018 and he is here today for a follow-up    Patient states that he started having GI symptoms in October 2019, mainly with mucousy stools with some abdominal cramps.  He only saw blood about 3 times when he was admitted to [facility] on December/05/2017 with bloody diarrhea and abdominal pain, treated symptomatically then he was a started on prednisone, ciprofloxacin and Flagyl by his PCP in January 2020.  Patient states that prednisone did help a little bit specifically in his lethargy and fatigue symptoms but also help with his bloody diarrhea.  Patient/2020 tested positive for C. difficile on 04/07/2018.  He was initially treated with a course of PO vancomycin, however, he continued to have unchanged diarrhea and repeat testing on 05/21/18 showed persistent C diff.  He was then treated with a 10 day course of fidaxomicin.  This decreased the number of his stools from 5?6/day to 3 a day but still mushy with occasional blood.  Most recent positive C. difficile was on 06/24/2018 and patient eventually required F MT on 07/30/2018.  Colonoscopy performed for FMT showed normal ileum and colon.  However random colon biopsies showed chronic active colitis with cryptitis, crypt abscesses negative for viral inclusions and dysplasia.  Patient continued to be symptomatic, having 3?5 bowel movements/day, occasionally bloody about once a week, associated with diffuse abdominal pain, stabbing, 7/10 in severity, without radiation, without precipitating or aggravating factors, with weight loss of 6 pounds but no melena, fevers, chills, or other GI symptoms.  Repeated colonoscopy 09/09/2018 showed altered vascular pattern with mild inflamed mucosa in the transverse colon, and granular mucosa in the rectum otherwise normal.  Small bowel also looked normal.  Biopsies from the TI were normal, biopsies from the transverse colon and rectum showed active colitis with cryptitis and crypt abscesses.  Biopsies from the rest of the colon were normal.  Patient also had EGD the same day 9/1 that showed normal esophagus dilated up to 18 mm, small hiatal hernia, otherwise normal.    Patient was seen in clinic 9/8 and decision was made to start patient on mesalamine [Lialda 4.8 g daily].  He comes back today for follow-up.  He continues to experience similar symptoms including abdominal cramps, nausea with persistent diarrhea about 5?6 BMs/day with infrequent blood.  Patient also reports having some swallowing difficulties that was more noticeable with the Bentyl so he stopped taking the medication despite helping him with the abdominal cramps.  Otherwise denies fevers, chills, weight or appetite loss, or other GI symptoms     Review of Systems   Constitutional: Positive for appetite change, chills, fatigue and fever.   HENT: Positive for congestion, rhinorrhea, sneezing, sore throat and trouble swallowing.    Eyes: Negative.    Respiratory: Positive for chest tightness.    Cardiovascular: Negative.    Gastrointestinal: Positive for abdominal pain, blood in stool, diarrhea, nausea and rectal pain.   Endocrine: Negative.  Genitourinary: Positive for frequency and urgency.   Musculoskeletal: Positive for arthralgias, back pain, myalgias and neck pain.   Skin: Negative.    Allergic/Immunologic: Negative.    Neurological: Positive for dizziness, weakness, light-headedness, numbness and headaches. Psychiatric/Behavioral: Positive for decreased concentration and sleep disturbance. The patient is nervous/anxious.          Objective:         ? atenoloL (TENORMIN) 25 mg tablet Take 1.5 tablets by mouth daily. (Patient taking differently: Take 37.5 mg by mouth daily. 1 and a half tablet daily)   ? busPIRone (BUSPAR) 15 mg tablet Take 15 mg by mouth twice daily.   ? dicyclomine (BENTYL) 20 mg tablet TAKE 1 TABLET BY MOUTH 4 TIMES DAILY AS NEEDED FOR ABDOMINAL CRAMPING   ? mesalamine (LIALDA) 1.2 gram tablet Take four tablets by mouth daily with breakfast. Swallow whole; do not break, chew or crush.  Administer with a meal.   ? PARoxetine (PAXIL) 10 mg tablet Daily     Vitals:    10/21/18 0900   BP: 117/63   BP Source: Arm, Left Upper   Patient Position: Sitting   Pulse: 64   Temp: 36.7 ?C (98 ?F)   TempSrc: Oral   Weight: 66.6 kg (146 lb 12.8 oz)   Height: 162.6 cm (64.02)   PainSc: Six     Body mass index is 25.19 kg/m?Marland Kitchen     Physical Exam  General: ?Alert, cooperative, no distress, appears stated age.   Head: ?Normocephalic, without obvious abnormality, atraumatic  Throat: Lips, mucosa normal.   Lungs: Breathing comfortably  Chest wall: ?No tenderness or deformity.  Heart: ??Regular rate and rhythm  Abdomen: ?soft, non distended, non tender, BS+  Extremities: Extremities normal, atraumatic, no cyanosis or edema  Skin: Skin color, texture, turgor normal. 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 Allena Katz after his colonoscopy and pathology x2 were suggestive of inflammatory bowel disease.  Patient was initially seen in our clinic via Zoom on 09/16/2018 and he is here today for a follow-up    #Recurrent C. difficile infection: -First diagnosed 04/07/2018, treated with a course of p.o. vancomycin with no response  -Tested positive again for C. difficile on 05/19/2018.  He was then treated with a 10 day course of fidaxomicin.  This decreased the number of his stools from 5?6/day to 3 a day but still mushy with occasional blood.  -Most recent positive C. difficile was on 06/24/2018 and patient eventually required F MT on 07/30/2018.   -Continue to be symptomatic with mushy diarrhea, infrequently bloody.  However this could be related to underlying inflammatory bowel disease    #Inflammatory bowel disease:  -Patient developed GI symptoms of mucousy stools with occasional blood starting October 2019   -Was put on prednisone, Cipro and Flagyl by his PCP in January 2020  -Colonoscopy  07/30/2018 showed normal ileum and colon.  However random colon biopsies showed chronic active colitis with cryptitis, crypt abscesses negative for viral inclusions and dysplasia.   - Repeated colonoscopy 09/09/2018 showed altered vascular pattern with mild inflamed mucosa in the transverse colon, and granular mucosa in the rectum otherwise normal.  Small bowel also looked normal.  Biopsies from  the TI were normal, biopsies from the transverse colon and rectum showed active colitis with cryptitis and crypt abscesses.  Biopsies from the rest of the colon were normal.    - EGD the same day 9/1 that showed normal esophagus dilated up to 18 mm, small hiatal hernia, otherwise normal.  -Family history of severe complicated Crohn's disease in  his maternal grandmother  - Most recent labs from 08/2018 showed mild anemia and increased eosinophils  -Started on mesalamine 09/16/2018 [taking Lialda 4.8 g daily] with minimal response.    #POTS:   -Follows with cardiology    Plan:  ? Patient continues to haveabdominal pain with persistent diarrhea, sometimes bloody.  As discussed before endoscopic findings of inflammation in the transverse colon and some granularity in the rectum, with histologic evidence of chronic active colitis.  Patient was treated with prednisone in January 2020 that could have masked the endoscopic findings of ulcerative colitis.  Discussed these findings with the patient and his family and that this most likely represents underlying inflammatory bowel disease, favors ulcerative colitis since TI has been normal and also CT enterography in 04/25/2018 did not show any small bowel disease    ? Given the endoscopic and histologic findings, together with persistent symptoms, decision was made to start mesalamine 4.8 g daily.  However patient reports minimal if any response to mesalamine (but bleeding improved).  Explained to the patient that this persistent abdominal cramps and diarrhea could be related to underlying IBD versus irritable bowel syndrome.  We will check fecal calprotectin.  If elevated or patient continues to have symptoms we will plan to repeat colonoscopy with re-biopsying  ? If continues to have evidence of active inflammation despite mesalamine we will conside switchingr Lialda to other mesalamine agents like a cycle versus step up therapy including anti-TNF versus anti-integrin's versus anti-IL 12/23  ? Also if continues to have swallowing difficulties we will schedule for esophageal HRM  ? Patient is going to [facility] [place] Monday for evaluation/second opinion on GI, cardiac, other internal medicine issues  ? Continue to avoid NSAIDs and narcotics.  Tylenol and tramadol okay for pain.  ? Avoid smoking  ? Follow-up accordingly    Return to clinic in 4 weeks    Patient was advised of the plan and voiced agreement and understanding.     This note was in part completed with Dragon, a voice to text dictation system. Some errors may have occured and persist despite my best efforts to edit this document to eliminate dictation/translation related errors.  If you have questions/concerns, please contact me for clarification.     Ramond Craver Gastroenterology attending  Pager: [phone]

## 2018-10-24 LAB — CALPROTECTIN, FECAL

## 2018-10-27 ENCOUNTER — Ambulatory Visit: Admit: 2018-10-27 | Discharge: 2018-10-27 | Payer: Private Health Insurance - Indemnity

## 2018-10-27 DIAGNOSIS — R1310 Dysphagia, unspecified: Secondary | ICD-10-CM

## 2018-11-25 ENCOUNTER — Encounter: Admit: 2018-11-25 | Discharge: 2018-11-25 | Payer: Private Health Insurance - Indemnity

## 2018-11-25 ENCOUNTER — Ambulatory Visit: Admit: 2018-11-25 | Discharge: 2018-11-26 | Payer: Private Health Insurance - Indemnity

## 2018-11-25 DIAGNOSIS — M6289 Other specified disorders of muscle: Secondary | ICD-10-CM

## 2018-11-25 DIAGNOSIS — I499 Cardiac arrhythmia, unspecified: Secondary | ICD-10-CM

## 2018-11-25 MED ORDER — MESALAMINE 800 MG PO TBEC
1.6 g | ORAL_TABLET | Freq: Three times a day (TID) | ORAL | 5 refills | 30.00000 days | Status: DC
Start: 2018-11-25 — End: 2018-11-26

## 2018-11-25 NOTE — Progress Notes
Telehealth Visit Note    Date of Service: 11/25/2018    Subjective:      Obtained patient's verbal consent to treat them and their agreement to [facility] financial policy and NPP via this telehealth visit during the [facility]       Jesse Peters is a 28 y.o. male.    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 Allena Katz after his colonoscopy and pathology x3 were suggestive of inflammatory bowel disease.  Patient was initially seen in our clinic via Zoom on 09/16/2018, then most recently 10/21/2018.  I video called the patient today via Zoom for follow-up visit Patient states that he started having GI symptoms in October 2019, mainly with mucousy stools with some abdominal cramps.  He only saw blood about 3 times when he was admitted to [facility] on December/05/2017 with bloody diarrhea and abdominal pain, treated symptomatically then he was a started on prednisone, ciprofloxacin and Flagyl by his PCP in January 2020.  Patient states that prednisone did help a little bit specifically in his lethargy and fatigue symptoms but also help with his bloody diarrhea.  Patient/2020 tested positive for C. difficile on 04/07/2018.  He was initially treated with a course of PO vancomycin, however, he continued to have unchanged diarrhea and repeat testing on 05/21/18 showed persistent C diff.  He was then treated with a 10 day course of fidaxomicin.  This decreased the number of his stools from 5?6/day to 3 a day but still mushy with occasional blood.  Most recent positive C. difficile was on 06/24/2018 and patient eventually required F MT on 07/30/2018.  Colonoscopy performed for FMT showed normal ileum and colon.  However random colon biopsies showed chronic active colitis with cryptitis, crypt abscesses negative for viral inclusions and dysplasia.  Patient continued to be symptomatic, having 3?5 bowel movements/day, occasionally bloody about once a week, associated with diffuse abdominal pain, stabbing, 7/10 in severity, without radiation, without precipitating or aggravating factors, with weight loss of 6 pounds but no melena, fevers, chills, or other GI symptoms.  Repeated colonoscopy 09/09/2018 showed altered vascular pattern with mild inflamed mucosa in the transverse colon, and granular mucosa in the rectum otherwise normal.  Small bowel also looked normal.  Biopsies from the TI were normal, biopsies from the transverse colon and rectum showed active colitis with cryptitis and crypt abscesses.  Biopsies from the rest of the colon were normal.  Patient also had EGD the same day 9/1 that showed normal esophagus dilated up to 18 mm, small hiatal hernia, otherwise normal.    Patient was seen in clinic 9/8 and decision was made to start patient on mesalamine [Lialda 4.8 g daily].  He comes back today for follow-up.  He continued to experience similar symptoms including abdominal cramps, nausea with persistent diarrhea about 5?6 BMs/day with infrequent blood.  Patient also reported having some swallowing difficulties that was more noticeable with the Bentyl so he stopped taking the medication despite helping him with the abdominal cramps.  Otherwise denies fevers, chills, weight or appetite loss, or other GI symptoms.     After last clinic visit in October and since patient continued to report mushy stools we checked fecal calprotectin that was normal <5.  Following this patient was evaluated at  [facility] in [place], [place] and he spent 9 days there.  He had extensive cardiac, medicine, endocrine, GI work-up and eventually it was determined that he does have POTS.  Also he had repeated colonoscopy that was normal, normal MR enterography, however biopsies taking from the colon showed focal active colitis concerning for underlying inflammatory bowel disease.  Reviewing the records from [place] [facility] decision was made also [facility] that most likely patient does have underlying inflammatory bowel bowel disease.  He was instructed to continue mesalamine which he continued at a dose of 4.8 g daily.  Today, patient states that he is overall doing okay.  He still has up to 6 mushy BMs/day.  He also states that he was seeing white chalky substance needs to entering to be the lialda pills.  Otherwise denies rectal bleeding, fevers, chills, weight or appetite loss, or other GI symptoms.  Also denies skin rash, vision problems joint pain or any extraintestinal manifestations.       Review of Systems   Constitutional: Negative.    HENT: Negative.    Eyes: Negative. Respiratory: Negative.    Cardiovascular: Negative.    Gastrointestinal: Negative.         Abnormal Stools     Endocrine: Negative.    Genitourinary: Negative.    Musculoskeletal: Negative.    Skin: Negative.    Allergic/Immunologic: Negative.    Neurological: Negative.    Hematological: Negative.    Psychiatric/Behavioral: Negative.    All other systems reviewed and are negative.        Objective:         ? atenoloL (TENORMIN) 25 mg tablet Take 1.5 tablets by mouth daily. (Patient taking differently: Take 37.5 mg by mouth daily. 1 and a half tablet daily)   ? busPIRone (BUSPAR) 15 mg tablet Take 15 mg by mouth twice daily.   ? dicyclomine (BENTYL) 20 mg tablet TAKE 1 TABLET BY MOUTH 4 TIMES DAILY AS NEEDED FOR ABDOMINAL CRAMPING   ? mesalamine (LIALDA) 1.2 gram tablet Take four tablets by mouth daily with breakfast. Swallow whole; do not break, chew or crush.  Administer with a meal.   ? PARoxetine (PAXIL) 10 mg tablet Daily     There were no vitals filed for this visit.  There is no height or weight on file to calculate BMI.     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 Allena Katz after his colonoscopy and pathology x3 were suggestive of inflammatory bowel disease.  Patient was initially seen in our clinic via Zoom on 09/16/2018, then most recently 10/21/2018.  I video called the patient today via Zoom for follow-up visit    #Recurrent C. difficile infection:  -First diagnosed 04/07/2018, treated with a course of p.o. vancomycin with no response -Tested positive again for C. difficile  on 05/19/2018.  He was then treated with a 10 day course of fidaxomicin.  This decreased the number of his stools from 5?6/day to 3 a day but still mushy with occasional blood.  -Most recent positive C. difficile was on 06/24/2018 and patient eventually required F MT on 07/30/2018.   -Continue to be symptomatic with mushy diarrhea, but overall improving.  However this could be related to underlying inflammatory bowel disease versus postinfectious IBS    #Undetermined?inflammatory bowel disease (U?IBD):  -Patient developed GI symptoms of mucousy stools with occasional blood starting October 2019   -Started on prednisone, Cipro and Flagyl by his PCP in January 2020  -Colonoscopy  07/30/2018 showed normal ileum and colon.  However random colon biopsies showed chronic active colitis with cryptitis, crypt abscesses negative for viral inclusions and dysplasia.   - Repeated colonoscopy 09/09/2018 showed altered vascular pattern with mild inflamed mucosa in the transverse colon, and granular mucosa in the rectum otherwise normal.  Small bowel also looked normal.  Biopsies from the TI were normal, biopsies from the transverse colon and rectum showed active colitis with cryptitis and crypt abscesses.  Biopsies from the rest of the colon were normal.    - EGD 9/1 showed normal esophagus dilated up to 18 mm, small hiatal hernia, otherwise normal.  -Family history of severe complicated Crohn's disease in  his maternal grandmother  - labs from 08/2018 showed mild anemia and increased eosinophils  -Started on mesalamine 09/16/2018 [taking Lialda 4.8 g daily] with partial response.  - fecal calprotectin normal <5 at [facility] on 10/21/2018.  However patient was already on treatment on mesalamine when it was checked.  Most recently repeated fecal calprotectin was also normal 10/31/2018 at [facility]  -Repeated colonoscopy at [facility] [place] on 10/30/2018 was normal, also had normal MR enterography -Biopsies taking from the colon showed focal active colitis concerning for underlying inflammatory bowel disease.    -He was instructed to continue mesalamine which he continued at a dose of 4.8 g daily.  -Overall patient's clinical presentation together with endoscopic, and histologic findings are suggestive of inflammatory bowel disease.  He has recurrent C. difficile colitis in young age, with colonoscopy following treatment of C. difficile colitis first antral erythema but second and third time normal.  However biopsies in 3 times continue to show active colitis.  Fecal calprotectin was negative x2 however patient was on treatment when checked.  Due to the patchy activity of disease on biopsies this could represent Crohn's colitis.  At this time there is no evidence of extracolonic involvement based on previous work-up including CT enterography and MR enterography.  Based on the aforementioned data, we will call at U-IBD at this time    #POTS:   -Follows with cardiology  -Seen at [facility] and diagnosis was confirmed    Plan:  ? Patient continues to haveabdominal pain with persistent diarrhea, sometimes bloody.  As discussed before endoscopic findings of inflammation in the transverse colon and some granularity in the rectum, with histologic evidence of chronic active colitis.  Patient was treated with prednisone in January 2020 that could have masked the endoscopic findings of ulcerative colitis.  Discussed these findings with the patient and his family and that this most likely represents underlying inflammatory bowel disease, favors ulcerative colitis since TI has been normal and also CT enterography in 04/25/2018 did not show any small bowel disease, moreover MR enterography 10/22 was also normal.  However due to patchy involvement of the colon Crohn's  disease could not be ruled out.  At this time we will call it U?IBD ? Given the endoscopic and histologic findings, together with persistent symptoms, decision was made to start mesalamine 4.8 g daily.  However patient reports partial response to mesalamine.  Fecal calprotectin was negative however after treatment.  His symptoms could be related to underlying IBD versus postinfectious IBS.  For now we will continue on mesalamine.  Patient reports seeing chalky tablets in the stool (Lialda pills) questioning the effect of this medication.  We will switch to a cycle continue 4.8 g a day  ? We will continue to monitor closely, and if symptoms persist or patient continues to have active disease we will discuss step up therapy including anti-TNF versus anti-integrin's versus anti-IL 12/23  ? Also if continues to have swallowing difficulties we will schedule for esophageal HRM  ? Continue to avoid NSAIDs and narcotics.  Tylenol and tramadol okay for pain.  ? Avoid smoking  ? Follow-up accordingly    Return to clinic in 4 weeks via telehealth    Patient was advised of the plan and voiced agreement and understanding.     This note was in part completed with Dragon, a voice to text dictation system. Some errors may have occured and persist despite my best efforts to edit this document to eliminate dictation/translation related errors.  If you have questions/concerns, please contact me for clarification.     Ramond Craver  Gastroenterology attending  Pager: [phone]      40 minutes spent on this patient's encounter with counseling and coordination of care taking >50% of the visit.  This time also included reviewing the clinical notes, endoscopy results, radiology results beside labs from [facility] recent visit

## 2018-11-26 MED ORDER — MESALAMINE 800 MG PO TBEC
Freq: Three times a day (TID) | ORAL | 1 refills | 30.00000 days | Status: DC
Start: 2018-11-26 — End: 2018-12-10

## 2018-12-05 ENCOUNTER — Encounter: Admit: 2018-12-05 | Discharge: 2018-12-05 | Payer: Private Health Insurance - Indemnity

## 2018-12-10 MED ORDER — MESALAMINE 0.375 GRAM PO CP24
4 | ORAL_CAPSULE | Freq: Every morning | ORAL | 5 refills | 30.00000 days | Status: DC
Start: 2018-12-10 — End: 2019-07-20

## 2018-12-24 ENCOUNTER — Encounter: Admit: 2018-12-24 | Discharge: 2018-12-24 | Payer: Private Health Insurance - Indemnity

## 2018-12-24 ENCOUNTER — Ambulatory Visit: Admit: 2018-12-24 | Discharge: 2018-12-24 | Payer: Private Health Insurance - Indemnity

## 2018-12-24 DIAGNOSIS — I499 Cardiac arrhythmia, unspecified: Secondary | ICD-10-CM

## 2018-12-24 DIAGNOSIS — K5100 Ulcerative (chronic) pancolitis without complications: Secondary | ICD-10-CM

## 2018-12-24 NOTE — Progress Notes
Ophthalmology Clinic    Encounter Date: 12/24/2018    Subjective:    Jesse Peters is a 28 y.o. male .  Subjective   Eye Problem (NP; Patient states he was referred by the [facility] clinic for baseline exam and to also rule out scleritis.), Eye Pain (Denies pain, itching, burning, fbs OU.), and Spots and/or Floaters (Denies floaters OU.)           HPI   NP here for evaluation of possible scleritis. Was diagnosed with possible ulcerative colitis at [facility]. On Mesalamine. Had an episode where his right eye got very painful and red and then got better on its own after 3 days. Not using any drops currently.     Medical History:   Diagnosis Date   ? Arrhythmia      Family History   Problem Relation Age of Onset   ? Arrhythmia Father    ? Heart Attack Maternal Grandmother         47s   ? Crohn's Disease Maternal Grandmother    ? Heart Disease Maternal Grandmother    ? Heart Attack Paternal Grandfather 51   ? Autoimmune Disease Mother         Myasthenia gravis   ? Blindness Neg Hx    ? Glaucoma Neg Hx    ? Macular Degen Neg Hx      Social History     Socioeconomic History   ? Marital status: Married     Spouse name: Not on file   ? Number of children: Not on file   ? Years of education: Not on file   ? Highest education level: Not on file   Occupational History   ? Occupation: Runner, broadcasting/film/video   Tobacco Use   ? Smoking status: Never Smoker   ? Smokeless tobacco: Former Neurosurgeon     Types: Chew   Substance and Sexual Activity   ? Alcohol use: Yes     Comment: Minimal   ? Drug use: Not Currently   ? Sexual activity: Not on file   Other Topics Concern   ? Not on file   Social History Narrative   ? Not on file         Current Outpatient Medications:   ?  atenoloL (TENORMIN) 25 mg tablet, Take 1.5 tablets by mouth daily. (Patient taking differently: Take 37.5 mg by mouth daily. 1 and a half tablet daily), Disp: 135 tablet, Rfl: 3  ?  busPIRone (BUSPAR) 15 mg tablet, Take 15 mg by mouth twice daily., Disp: , Rfl: ?  dicyclomine (BENTYL) 20 mg tablet, TAKE 1 TABLET BY MOUTH 4 TIMES DAILY AS NEEDED FOR ABDOMINAL CRAMPING, Disp: , Rfl:   ?  Mesalamine 0.375 gram cp24, Take four capsules by mouth every morning., Disp: 120 capsule, Rfl: 5  ?  PARoxetine (PAXIL) 10 mg tablet, Daily, Disp: , Rfl:     is allergic to strawberry.    A 10 point review of systems was completes and was negative except what is stated in HPI.    Objective:    Base Eye Exam     Visual Acuity (Snellen - Linear)       Right Left    Dist sc 20/25 20/25 -2          Tonometry (iCare Tonometer, 1:41 PM)       Right Left    Pressure 13 11          Pupils  Dark Shape APD    Right 7 Round None    Left 7 Round None          Visual Fields       Left Right     Full Full          Extraocular Movement       Right Left     Full Full          Neuro/Psych     Oriented x3: Yes    Mood/Affect: Normal          Dilation     Both eyes: 1.0% Tropicamide, 2.5% Phenylephrine @ 2:01 PM            Slit Lamp and Fundus Exam     External Exam       Right Left    External Normal Normal          Slit Lamp Exam       Right Left    Lids/Lashes Normal Normal    Conjunctiva/Sclera White and quiet White and quiet    Cornea Clear Clear    Anterior Chamber Deep and quiet Deep and quiet    Iris Flat Flat    Lens Clear Clear    Vitreous Normal Normal          Fundus Exam       Right Left    Disc Sharp, healthy rim Sharp, healthy rim    C/D Ratio 0.3 0.3    Macula Flat Flat    Vessels Normal caliber and number Normal caliber and number    Periphery Attached, no breaks or tears Attached, no breaks or tears            Refraction     Manifest Refraction       Sphere Cylinder Axis Dist VA    Right -0.75 +0.25 012 NI    Left -1.25 +0.50 175 20/20                     Problem   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

## 2018-12-24 NOTE — Assessment & Plan Note
No evidence of scleritis or uveitis on exam today   Discussed signs and symptoms of uveitis and scleritis and to return to clinic if he experiences any of these  Otherwise can follow up in 1 year

## 2019-01-05 ENCOUNTER — Encounter: Admit: 2019-01-05 | Discharge: 2019-01-05 | Payer: Private Health Insurance - Indemnity

## 2019-01-08 ENCOUNTER — Encounter: Admit: 2019-01-08 | Discharge: 2019-01-08 | Payer: Private Health Insurance - Indemnity

## 2019-01-08 DIAGNOSIS — K5190 Ulcerative colitis, unspecified, without complications: Secondary | ICD-10-CM

## 2019-01-08 DIAGNOSIS — I499 Cardiac arrhythmia, unspecified: Secondary | ICD-10-CM

## 2019-01-08 DIAGNOSIS — F419 Anxiety disorder, unspecified: Secondary | ICD-10-CM

## 2019-01-08 MED ORDER — LIDOCAINE HCL 2 % MM SOLN
0 refills | Status: DC
Start: 2019-01-08 — End: 2019-01-08
  Administered 2019-01-08: 14:00:00 15 mL via ORAL

## 2019-01-08 MED ORDER — LIDOCAINE HCL 2 % MM JELL
0 refills | Status: DC
Start: 2019-01-08 — End: 2019-01-08
  Administered 2019-01-08: 13:00:00 3 mL via TOPICAL

## 2019-01-09 ENCOUNTER — Encounter: Admit: 2019-01-09 | Discharge: 2019-01-09 | Payer: Private Health Insurance - Indemnity

## 2019-01-09 DIAGNOSIS — K5190 Ulcerative colitis, unspecified, without complications: Secondary | ICD-10-CM

## 2019-01-09 DIAGNOSIS — I499 Cardiac arrhythmia, unspecified: Secondary | ICD-10-CM

## 2019-01-09 DIAGNOSIS — F419 Anxiety disorder, unspecified: Secondary | ICD-10-CM

## 2019-01-23 ENCOUNTER — Encounter: Admit: 2019-01-23 | Discharge: 2019-01-23 | Payer: Private Health Insurance - Indemnity

## 2019-01-23 ENCOUNTER — Ambulatory Visit: Admit: 2019-01-23 | Discharge: 2019-01-24 | Payer: Private Health Insurance - Indemnity

## 2019-01-23 DIAGNOSIS — K5190 Ulcerative colitis, unspecified, without complications: Secondary | ICD-10-CM

## 2019-01-23 DIAGNOSIS — K529 Noninfective gastroenteritis and colitis, unspecified: Secondary | ICD-10-CM

## 2019-01-23 DIAGNOSIS — Z20822 Encounter for screening laboratory testing for COVID-19 virus in asymptomatic patient: Secondary | ICD-10-CM

## 2019-01-23 DIAGNOSIS — F419 Anxiety disorder, unspecified: Secondary | ICD-10-CM

## 2019-01-23 DIAGNOSIS — I499 Cardiac arrhythmia, unspecified: Secondary | ICD-10-CM

## 2019-01-23 MED ORDER — SUPREP BOWEL PREP KIT 17.5-3.13-1.6 GRAM PO SOLR
354 mL | Freq: Once | ORAL | 0 refills | Status: AC
Start: 2019-01-23 — End: ?

## 2019-01-23 MED ORDER — MESALAMINE 1,000 MG RE SUPP
1000 mg | Freq: Every evening | RECTAL | 1 refills | 30.00000 days | Status: AC
Start: 2019-01-23 — End: ?

## 2019-01-23 NOTE — Progress Notes
Telehealth Visit Note    Date of Service: 01/23/2019    Subjective:      Obtained patient's verbal consent to treat them and their agreement to [facility] financial policy and NPP via this telehealth visit during the [facility] Emergency       Jesse Peters is a 29 y.o. male.    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 Allena Katz after his colonoscopy and pathology x2 were suggestive of inflammatory bowel disease.  Patient was initially seen in our clinic via Zoom on 09/16/2018 and most recently 10/21/2018.  I video called the patient today for a telemedicine clinic visit.  Patient is also follows with [facility] gastroenterology; Dr. Lora Havens Patient states that he started having GI symptoms in October 2019, mainly with mucousy stools with some abdominal cramps.  He only saw blood about 3 times when he was admitted to [facility] on December/05/2017 with bloody diarrhea and abdominal pain, treated symptomatically then he was a started on prednisone, ciprofloxacin and Flagyl by his PCP in January 2020.  Patient states that prednisone did help a little bit specifically in his lethargy and fatigue symptoms but also help with his bloody diarrhea.  Patient/2020 tested positive for C. difficile on 04/07/2018.  He was initially treated with a course of PO vancomycin, however, he continued to have unchanged diarrhea and repeat testing on 05/21/18 showed persistent C diff.  He was then treated with a 10 day course of fidaxomicin.  This decreased the number of his stools from 5?6/day to 3 a day but still mushy with occasional blood.  Most recent positive C. difficile was on 06/24/2018 and patient eventually required F MT on 07/30/2018.  Colonoscopy performed for FMT showed normal ileum and colon.  However random colon biopsies showed chronic active colitis with cryptitis, crypt abscesses negative for viral inclusions and dysplasia.  Patient continued to be symptomatic, having 3?5 bowel movements/day, occasionally bloody about once a week, associated with diffuse abdominal pain, stabbing, 7/10 in severity, without radiation, without precipitating or aggravating factors, with weight loss of 6 pounds but no melena, fevers, chills, or other GI symptoms.  Repeated colonoscopy 09/09/2018 showed altered vascular pattern with mild inflamed mucosa in the transverse colon, and granular mucosa in the rectum otherwise normal.  Small bowel also looked normal.  Biopsies from the TI were normal, biopsies from the transverse colon and rectum showed active colitis with cryptitis and crypt abscesses.  Biopsies from the rest of the colon were normal.  Patient also had EGD the same day 9/1 that showed normal esophagus dilated up to 18 mm, small hiatal hernia, otherwise normal.    Patient was seen in clinic 9/8 and decision was made to start patient on mesalamine [Lialda 4.8 g daily].  He continued to experience similar symptoms including abdominal cramps, nausea with persistent diarrhea about 5?6 BMs/day with infrequent blood.  Patient also reported having some swallowing difficulties that was more noticeable with the Bentyl so he stopped taking the medication despite helping him with the abdominal cramps.  Otherwise denies fevers, chills, weight or appetite loss, or other GI symptoms    Please headache    Medical History:   Diagnosis Date   ? Anxiety    ? Arrhythmia    ? UC (ulcerative  colitis) [facility])        Surgical History:   Procedure Laterality Date   ? PREPARATION FECAL MICROBIOTA FOR INSTILLATION WITH ASSESSMENT DONOR SPECIMEN, administered via colonoscopy N/A 07/30/2018    Performed by Lenor Derrick, MD at [facility] ENDO   ? COLONOSCOPY VIA STOMA WITH BIOPSY  07/30/2018    Performed by Lenor Derrick, MD at [facility] ENDO   ? ESOPHAGOGASTRODUODENOSCOPY WITH BIOPSY - FLEXIBLE N/A 09/09/2018    Performed by Samuel Jester, MD at [facility] ENDO   ? COLONOSCOPY WITH BIOPSY - FLEXIBLE  09/09/2018    Performed by Samuel Jester, MD at [facility] ENDO   ? HEART CATHETERIZATION  10/2018    [facility]   ? ESOPHAGEAL MOTILITY STUDY N/A 01/08/2019    Performed by Meyer Cory, MD at [facility] ENDO       Family History   Problem Relation Age of Onset   ? Arrhythmia Father    ? Heart Attack Maternal Grandmother         67s   ? Crohn's Disease Maternal Grandmother    ? Heart Disease Maternal Grandmother    ? Heart Attack Paternal Grandfather 24   ? Autoimmune Disease Mother         Myasthenia gravis   ? Blindness Neg Hx    ? Glaucoma Neg Hx    ? Macular Degen Neg Hx        Social History     Socioeconomic History   ? Marital status: Married     Spouse name: Not on file   ? Number of children: Not on file ? Years of education: Not on file   ? Highest education level: Not on file   Occupational History   ? Occupation: Runner, broadcasting/film/video   Tobacco Use   ? Smoking status: Never Smoker   ? Smokeless tobacco: Former Neurosurgeon     Types: Chew   Substance and Sexual Activity   ? Alcohol use: Yes     Comment: Minimal   ? Drug use: Not Currently   ? Sexual activity: Not on file   Other Topics Concern   ? Not on file   Social History Narrative   ? Not on file              Review of Systems   Constitutional: Negative.    HENT: Negative.    Eyes: Negative.    Respiratory: Negative.    Cardiovascular: Negative.    Gastrointestinal: Negative.    Endocrine: Negative.    Genitourinary: Negative.    Musculoskeletal: Negative.    Skin: Negative.    Allergic/Immunologic: Negative.    Neurological: Negative.    Hematological: Negative.    Psychiatric/Behavioral: Negative.    All other systems reviewed and are negative.        Objective:         ? atenoloL (TENORMIN) 25 mg tablet Take 1.5 tablets by mouth daily. (Patient taking differently: Take 37.5 mg by mouth daily. 1 and a half tablet daily)   ? busPIRone (BUSPAR) 15 mg tablet Take 15 mg by mouth twice daily.   ? dicyclomine (BENTYL) 20 mg tablet TAKE 1 TABLET BY MOUTH 4 TIMES DAILY AS NEEDED FOR ABDOMINAL CRAMPING   ? fludrocortisone (FLORINEF) 0.1 mg tablet Take 0.1 mg by mouth daily.   ? Mesalamine 0.375 gram cp24 Take four capsules by mouth every morning.   ? midodrine (PROAMATINE) 2.5 mg tablet TAKE 1 TABLET BY MOUTH THREE TIMES DAILY AS  NEEDED FOR LOW BLOOD PRESSURE. DO NOT LAY DOWN FOR 4 HOURS AFTER EACH DOSE   ? PARoxetine (PAXIL) 10 mg tablet Daily     There were no vitals filed for this visit.  There is no height or weight on file to calculate BMI.     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 Allena Katz after his colonoscopy and pathology x2 were suggestive of inflammatory bowel disease.  Patient was initially seen in our clinic via Zoom on 09/16/2018 and most recently 10/21/2018.  I video called the patient today for a telemedicine clinic visit.  Patient is also follows with [facility] gastroenterology; Dr. Lora Havens    #Recurrent C. difficile infection:  -First diagnosed 04/07/2018, treated with a course of p.o. vancomycin with no response  -Tested positive again for C. difficile on 05/19/2018.  He was then treated with a 10 day course of fidaxomicin.  This decreased the number of his stools from 5?6/day to 3 a day but still mushy with occasional blood.  -Most recent positive C. difficile was on 06/24/2018 and patient eventually required F MT on 07/30/2018.   -Continue to be symptomatic with mushy diarrhea, infrequently bloody.  However this could be related to underlying inflammatory bowel disease    #Inflammatory bowel disease:  -Patient developed GI symptoms of mucousy stools with occasional blood starting October 2019   -Was put on prednisone, Cipro and Flagyl by his PCP in January 2020  -Colonoscopy  07/30/2018 showed normal ileum and colon.  However random colon biopsies showed chronic active colitis with cryptitis, crypt abscesses negative for viral inclusions and dysplasia. - Repeated colonoscopy 09/09/2018 showed altered vascular pattern with mild inflamed mucosa in the transverse colon, and granular mucosa in the rectum otherwise normal.  Small bowel also looked normal.  Biopsies from the TI were normal, biopsies from the transverse colon and rectum showed active colitis with cryptitis and crypt abscesses.  Biopsies from the rest of the colon were normal.    - EGD the same day 9/1 that showed normal esophagus dilated up to 18 mm, small hiatal hernia, otherwise normal.  -Family history of severe complicated Crohn's disease in  his maternal grandmother  - Most recent labs from 08/2018 showed mild anemia and increased eosinophils  -Started on mesalamine 09/16/2018 [taking Lialda 4.8 g daily] with partial response  - Patient was seen at [facility] for second opinion and plan was to continue on Lialda.  He had repeated colonoscopy there 10/30/2018 that was normal.  Biopsies from the TI, right colon, left colon were all normal.  Biopsies from the rectum did show mild focal active chronic colitis.  -Currently still on Lialda 4.8 g a day, overall did benefit but only partially.  She reports mushy stools and also today patient reports intermittent rectal pain.  -Extraintestinal manifestations  of scleritis.  No other extraintestinal manifestations  -No IBD related malignancies    #POTS:   -Follows with cardiology    Plan: ? Patient continues to havea mushy stools but overall doing better than before.  As discussed before endoscopic findings of inflammation in the transverse colon and some granularity in the rectum, with histologic evidence of chronic active colitis.  Patient was treated with prednisone in January 2020 that could have masked the endoscopic findings of ulcerative colitis.  Discussed these findings with the patient and his family and that this most likely represents underlying inflammatory bowel disease, favors ulcerative colitis since TI has been normal and also CT enterography in 04/25/2018 did not show any small bowel disease    ? Given the endoscopic and histologic findings, together with persistent symptoms, decision was made to start mesalamine 4.8 g daily.  Also this was suggested by [facility]  ? We will continue on Lialda 4.8 g daily and add rectal suppositories with Canasa 1000 mg a day given the minimal activity in the rectum based on the most recent colonoscopy  ? If continues to have evidence of active inflammation despite mesalamine we will conside switchingr Lialda to other mesalamine agents like a cycle versus step up therapy including anti-TNF versus anti-integrin's versus anti-IL 12/23  ? Plan to repeat colonoscopy in 6 months from the most recent one in October and that would be around April 2021  ? Await pelvic floor training, referral was sent  ? Continue to avoid NSAIDs and narcotics.  Tylenol and tramadol okay for pain.  ? Avoid smoking  ? Follow-up accordingly    Plan to repeat colonoscopy in 3 months then return to clinic in 5 months    Patient was advised of the plan and voiced agreement and understanding.     This note was in part completed with Dragon, a voice to text dictation system. Some errors may have occured and persist despite my best efforts to edit this document to eliminate dictation/translation related errors.  If you have questions/concerns, please contact me for clarification. Ramond Craver  Gastroenterology attending  Pager: [phone]    30 minutes spent on this patient's encounter with counseling and coordination of care taking >50% of the visit.

## 2019-02-11 ENCOUNTER — Ambulatory Visit: Admit: 2019-02-11 | Discharge: 2019-02-11 | Payer: Private Health Insurance - Indemnity

## 2019-02-11 DIAGNOSIS — K529 Noninfective gastroenteritis and colitis, unspecified: Secondary | ICD-10-CM

## 2019-03-02 ENCOUNTER — Encounter: Admit: 2019-03-02 | Discharge: 2019-03-02 | Payer: Private Health Insurance - Indemnity

## 2019-03-31 ENCOUNTER — Encounter: Admit: 2019-03-31 | Discharge: 2019-03-31 | Payer: Private Health Insurance - Indemnity

## 2019-03-31 NOTE — Telephone Encounter
Received call from [facility] that he tested positive for COVID and is having increased abdominal pain and increased loose stools and wanted to know if there is anything he can/should do.    Discussed with Dr. Horald Pollen who said symptom management - fluids, and to avoid imodium as his sxs are likely d/t covid.     Called Jesse Peters back and explained the above. He verbalized understanding. I told him to let us know if his sxs worsen or don't improve in a week or two.

## 2019-04-03 ENCOUNTER — Encounter: Admit: 2019-04-03 | Discharge: 2019-04-03 | Payer: Private Health Insurance - Indemnity

## 2019-04-03 NOTE — Telephone Encounter
Received via fax from [facility] an approval for patient's colonoscopy.   Service Reference Number: [id number]    Document routed to be scanned.

## 2019-04-06 ENCOUNTER — Encounter: Admit: 2019-04-06 | Discharge: 2019-04-06 | Payer: Private Health Insurance - Indemnity

## 2019-04-07 ENCOUNTER — Encounter: Admit: 2019-04-07 | Discharge: 2019-04-07 | Payer: Private Health Insurance - Indemnity

## 2019-04-10 MED ORDER — DICYCLOMINE 10 MG PO CAP
10 mg | ORAL_CAPSULE | Freq: Three times a day (TID) | ORAL | 1 refills | Status: AC | PRN
Start: 2019-04-10 — End: ?

## 2019-04-14 ENCOUNTER — Encounter: Admit: 2019-04-14 | Discharge: 2019-04-14 | Payer: Private Health Insurance - Indemnity

## 2019-04-14 NOTE — Anesthesia Pre-Procedure Evaluation
Anesthesia Pre-Procedure Evaluation    Name: Jesse Peters      MRN: [id number]     DOB: 1990/08/22     Age: 29 y.o.     Sex: male   _________________________________________________________________________     Procedure Info:   Procedure Information     Date/Time: 04/15/19 1250    Procedure: Colonoscopy (N/A )    Location: ENDO 5 / ENDO/GI    Surgeons: Lenor Derrick, MD          Physical Assessment  Vital Signs (last filed in past 24 hours):         Patient History   Allergies   Allergen Reactions   ? Strawberry RASH        Current Medications    Medication Directions   atenoloL (TENORMIN) 25 mg tablet Take 1.5 tablets by mouth daily.  Patient taking differently: Take 37.5 mg by mouth daily. 1 and a half tablet daily   busPIRone (BUSPAR) 15 mg tablet Take 15 mg by mouth twice daily.   dicyclomine (BENTYL) 10 mg capsule Take one capsule by mouth three times daily as needed.   fludrocortisone (FLORINEF) 0.1 mg tablet Take 0.1 mg by mouth daily.   mesalamine (CANASA) 1,000 mg supp Insert or Apply one suppository to rectal area as directed at bedtime daily.   Mesalamine 0.375 gram cp24 Take four capsules by mouth every morning.   midodrine (PROAMATINE) 2.5 mg tablet TAKE 1 TABLET BY MOUTH THREE TIMES DAILY AS NEEDED FOR LOW BLOOD PRESSURE. DO NOT LAY DOWN FOR 4 HOURS AFTER EACH DOSE   PARoxetine (PAXIL) 10 mg tablet Daily         Review of Systems/Medical History      Patient summary reviewed  Nursing notes reviewed  Pertinent labs reviewed    PONV Screening: Non-smoker  No history of anesthetic complications  No family history of anesthetic complications      Airway - negative        Pulmonary           No sleep apnea (no diagnosis but snores, sometimes wakes feeling short of breath)      Cardiovascular       Recent diagnostic studies:          echocardiogram          Echo 2019  Left ventricular systolic function is within normal limits.  LVEF 60%  No significant valvular abnormalities.  No pericardial effusion. Exercise tolerance: >4 METS      Beta Blocker therapy: No      Beta blockers within 24 hours: n/a      Dysrhythmias (Tachycardia)      GI/Hepatic/Renal       Inflammatory bowel disease      Recurrent C diff  Hx of UC      Neuro/Psych           Psychiatric history          Anxiety      Musculoskeletal - negative        Endocrine/Other - negative      Constitution - negative   Physical Exam    Airway Findings      Mallampati: I      TM distance: >3 FB      Neck ROM: full      Mouth opening: good      Airway patency: adequate    Dental Findings: Negative      Cardiovascular Findings:  Rhythm: regular      Rate: normal    Pulmonary Findings:       Breath sounds clear to auscultation.    Abdominal Findings:       Not obese    Neurological Findings:       Alert and oriented x 3    Constitutional findings:       No acute distress       Diagnostic Tests  Hematology:   Lab Results   Component Value Date    HGB 14.7 04/07/2018    HCT 44.8 04/07/2018    PLTCT 259 04/07/2018    WBC 8.0 04/07/2018    NEUT 84.3 04/07/2018    ANC 6.7 04/07/2018    ALC 0.8 04/07/2018    MONA 5.6 04/07/2018    AMC 0.4 04/07/2018    EOSA 4 01/06/2018    ABC 0.0 04/07/2018    MCV 98.3 04/07/2018    MCH 32.2 04/07/2018    MCHC 32.7 04/07/2018    MPV 8.1 01/06/2018    RDW 12.5 04/07/2018         General Chemistry:   Lab Results   Component Value Date    NA 141 04/07/2018    K 4.8 04/07/2018    CL 104 04/07/2018    CO2 27.0 04/07/2018    GAP 15 04/07/2018    BUN 14.0 04/07/2018    CR 0.92 04/07/2018    GLU 123 04/07/2018    CA 9.9 04/07/2018    ALBUMIN 4.3 04/07/2018    MG 1.8 10/28/2017    TOTBILI 0.30 04/07/2018      Coagulation: No results found for: PT, PTT, INR      Anesthesia Plan    Plan: MAC  Induction method: intravenous    Comments: (29 year old male here for colonoscopy.  ASA 3.  Chart review only.  04/14/19 1814 Aaleah Hirsch)

## 2019-04-15 ENCOUNTER — Encounter: Admit: 2019-04-15 | Discharge: 2019-04-15 | Payer: Private Health Insurance - Indemnity

## 2019-04-16 ENCOUNTER — Ambulatory Visit: Admit: 2019-04-16 | Discharge: 2019-04-16 | Payer: Private Health Insurance - Indemnity

## 2019-05-14 ENCOUNTER — Encounter: Admit: 2019-05-14 | Discharge: 2019-05-14 | Payer: Private Health Insurance - Indemnity

## 2019-05-14 NOTE — Telephone Encounter
Jesse Pam, LPN at [facility], PA's office and patient was seen in his PCP's office a few days ago by the PA Melissa - he was having complaints of bloody stools 4-6x per week. Not feeling good. She did start him on Prednisone. His Colonoscopy was postponed because he got COVID. He called to reschedule, but was told August before he could be scoped.      I told Jesse Peters that we could definitely get him in before August.

## 2019-06-03 ENCOUNTER — Encounter: Admit: 2019-04-16 | Discharge: 2019-04-16 | Payer: Private Health Insurance - Indemnity

## 2019-06-03 ENCOUNTER — Encounter: Admit: 2019-06-03 | Discharge: 2019-06-03 | Payer: Private Health Insurance - Indemnity

## 2019-06-03 DIAGNOSIS — F419 Anxiety disorder, unspecified: Secondary | ICD-10-CM

## 2019-06-03 DIAGNOSIS — I499 Cardiac arrhythmia, unspecified: Secondary | ICD-10-CM

## 2019-06-03 DIAGNOSIS — K5190 Ulcerative colitis, unspecified, without complications: Secondary | ICD-10-CM

## 2019-06-03 MED ORDER — LACTATED RINGERS IV SOLP
INTRAVENOUS | 0 refills | Status: DC
Start: 2019-06-03 — End: 2019-06-08

## 2019-06-03 MED ORDER — PROPOFOL INJ 10 MG/ML IV VIAL
INTRAVENOUS | 0 refills | Status: DC
Start: 2019-06-03 — End: 2019-06-03

## 2019-06-03 MED ORDER — GLYCOPYRROLATE 0.2 MG/ML IJ SOLN
INTRAVENOUS | 0 refills | Status: DC
Start: 2019-06-03 — End: 2019-06-03

## 2019-06-03 MED ORDER — PROPOFOL 10 MG/ML IV EMUL 50 ML (INFUSION)(AM)(OR)
INTRAVENOUS | 0 refills | Status: DC
Start: 2019-06-03 — End: 2019-06-03

## 2019-06-03 MED ORDER — DEXMEDETOMIDINE IN 0.9 % NACL 80 MCG/20 ML (4 MCG/ML) IV SOLN
INTRAVENOUS | 0 refills | Status: DC
Start: 2019-06-03 — End: 2019-06-03

## 2019-06-03 MED ORDER — LIDOCAINE (PF) 10 MG/ML (1 %) IJ SOLN
.2 mL | INTRAMUSCULAR | 0 refills | Status: DC | PRN
Start: 2019-06-03 — End: 2019-06-08

## 2019-06-03 MED ORDER — GLYCOPYRROLATE 0.2 MG/ML IJ SOLN
.2 mg | Freq: Once | INTRAVENOUS | 0 refills | Status: AC | PRN
Start: 2019-06-03 — End: ?

## 2019-06-04 ENCOUNTER — Encounter: Admit: 2019-06-04 | Discharge: 2019-06-04 | Payer: Private Health Insurance - Indemnity

## 2019-06-04 DIAGNOSIS — F419 Anxiety disorder, unspecified: Secondary | ICD-10-CM

## 2019-06-04 DIAGNOSIS — I499 Cardiac arrhythmia, unspecified: Secondary | ICD-10-CM

## 2019-06-04 DIAGNOSIS — K5190 Ulcerative colitis, unspecified, without complications: Secondary | ICD-10-CM

## 2019-06-26 ENCOUNTER — Encounter: Admit: 2019-06-26 | Discharge: 2019-06-26 | Payer: Private Health Insurance - Indemnity

## 2019-06-26 ENCOUNTER — Ambulatory Visit: Admit: 2019-06-26 | Discharge: 2019-06-27 | Payer: Private Health Insurance - Indemnity

## 2019-06-26 DIAGNOSIS — F419 Anxiety disorder, unspecified: Secondary | ICD-10-CM

## 2019-06-26 DIAGNOSIS — K5100 Ulcerative (chronic) pancolitis without complications: Secondary | ICD-10-CM

## 2019-06-26 DIAGNOSIS — K580 Irritable bowel syndrome with diarrhea: Secondary | ICD-10-CM

## 2019-06-26 DIAGNOSIS — I499 Cardiac arrhythmia, unspecified: Secondary | ICD-10-CM

## 2019-06-26 DIAGNOSIS — K648 Other hemorrhoids: Secondary | ICD-10-CM

## 2019-06-26 DIAGNOSIS — A498 Other bacterial infections of unspecified site: Secondary | ICD-10-CM

## 2019-06-26 DIAGNOSIS — K5190 Ulcerative colitis, unspecified, without complications: Secondary | ICD-10-CM

## 2019-06-26 MED ORDER — NORTRIPTYLINE 10 MG PO CAP
10 mg | ORAL_CAPSULE | Freq: Every evening | ORAL | 0 refills | Status: AC
Start: 2019-06-26 — End: ?

## 2019-06-26 MED ORDER — HYDROCORTISONE 2.5 % TP CRPE
Freq: Two times a day (BID) | TOPICAL | 0 refills | 30.00000 days | Status: AC
Start: 2019-06-26 — End: ?

## 2019-06-26 NOTE — Progress Notes
Date of Service: 06/26/2019    Subjective:             Jesse Peters is a 29 y.o. male.    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 Allena Katz after his colonoscopy and pathology x2 were suggestive of inflammatory bowel disease.  Patient was initially seen in our clinic via Zoom on 09/16/2018 and most recently in 01/2019.  Today patient presents to clinic for a follow-up visit.    Patient states that he started having GI symptoms in October 2019, mainly with mucousy stools with some abdominal cramps.  He only saw blood about 3 times when he was admitted to [facility] on December/05/2017 with bloody diarrhea and abdominal pain, treated symptomatically then he was a started on prednisone, ciprofloxacin and Flagyl by his PCP in January 2020.  Patient states that prednisone did help a little bit specifically in his lethargy and fatigue symptoms but also help with his bloody diarrhea.  Patient/2020 tested positive for C. difficile on 04/07/2018.  He was initially treated with a course of PO vancomycin, however, he continued to have unchanged diarrhea and repeat testing on 05/21/18 showed persistent C diff.  He was then treated with a 10 day course of fidaxomicin.  This decreased the number of his stools from 5?6/day to 3 a day but still mushy with occasional blood.  Most recent positive C. difficile was on 06/24/2018 and patient eventually required F MT on 07/30/2018.  Colonoscopy performed for FMT showed normal ileum and colon.  However random colon biopsies showed chronic active colitis with cryptitis, crypt abscesses negative for viral inclusions and dysplasia.  Patient continued to be symptomatic, having 3?5 bowel movements/day, occasionally bloody about once a week, associated with diffuse abdominal pain, stabbing, 7/10 in severity, without radiation, without precipitating or aggravating factors, with weight loss of 6 pounds but no melena, fevers, chills, or other GI symptoms.  Repeated colonoscopy 09/09/2018 showed altered vascular pattern with mild inflamed mucosa in the transverse colon, and granular mucosa in the rectum otherwise normal.  Small bowel also looked normal.  Biopsies from the TI were normal, biopsies from the transverse colon and rectum showed active colitis with cryptitis and crypt abscesses.  Biopsies from the rest of the colon were normal.  Patient also had EGD the same day 9/1 that showed normal esophagus dilated up to 18 mm, small hiatal hernia, otherwise normal.    After patient was seen in clinic 9/8 he was started on mesalamine Lialda 4.8 g daily, with some improvement slightly continues to have mushy stools and occasional lower abdominal pain.    Due to persistent symptoms patient had repeated colonoscopy 06/03/2019 that showed internal hemorrhoids but otherwise normal TI and normal colon.  Also biopsies showed no evidence of active disease.    Medical History:   Diagnosis Date   ? Anxiety    ? Arrhythmia    ? UC (ulcerative colitis) [facility])        Surgical History:   Procedure Laterality Date   ? PREPARATION FECAL MICROBIOTA FOR INSTILLATION WITH ASSESSMENT DONOR SPECIMEN, administered via colonoscopy N/A 07/30/2018    Performed by Lenor Derrick, MD at [facility] ENDO   ? COLONOSCOPY VIA STOMA WITH BIOPSY  07/30/2018    Performed by Lenor Derrick, MD at [facility]  ENDO   ? ESOPHAGOGASTRODUODENOSCOPY WITH BIOPSY - FLEXIBLE N/A 09/09/2018    Performed by Samuel Jester, MD at [facility] ENDO   ? COLONOSCOPY WITH BIOPSY - FLEXIBLE  09/09/2018    Performed by Samuel Jester, MD at [facility] ENDO   ? HEART CATHETERIZATION  10/2018    [facility]   ? ESOPHAGEAL MOTILITY STUDY N/A 01/08/2019    Performed by Meyer Cory, MD at [facility] ENDO   ? Colonoscopy N/A 06/03/2019    Performed by Buckles, Vinnie Level, MD at [facility] OR Social History     Socioeconomic History   ? Marital status: Married     Spouse name: Not on file   ? Number of children: Not on file   ? Years of education: Not on file   ? Highest education level: Not on file   Occupational History   ? Occupation: Runner, broadcasting/film/video   Tobacco Use   ? Smoking status: Never Smoker   ? Smokeless tobacco: Former Neurosurgeon     Types: Chew   Substance and Sexual Activity   ? Alcohol use: Yes     Comment: Minimal   ? Drug use: Not Currently   ? Sexual activity: Not on file   Other Topics Concern   ? Not on file   Social History Narrative   ? Not on file       Family History   Problem Relation Age of Onset   ? Arrhythmia Father    ? Heart Attack Maternal Grandmother         26s   ? Crohn's Disease Maternal Grandmother    ? Heart Disease Maternal Grandmother    ? Heart Attack Paternal Grandfather 76   ? Autoimmune Disease Mother         Myasthenia gravis   ? Blindness Neg Hx    ? Glaucoma Neg Hx    ? Macular Degen Neg Hx           Review of Systems   Constitutional: Positive for activity change.   HENT: Positive for congestion, sinus pressure and sore throat.    Eyes: Negative.    Respiratory: Positive for chest tightness and shortness of breath.    Cardiovascular: Negative.    Gastrointestinal: Positive for abdominal pain, anal bleeding, blood in stool, diarrhea and rectal pain.   Endocrine: Negative.    Genitourinary: Negative.    Musculoskeletal: Negative.    Skin: Negative.    Allergic/Immunologic: Negative.    Neurological: Negative.    Hematological: Negative.    Psychiatric/Behavioral: Negative.          Objective:         ? atenoloL (TENORMIN) 25 mg tablet Take 1.5 tablets by mouth daily. (Patient taking differently: Take 37.5 mg by mouth daily. 1 and a half tablet daily)   ? busPIRone (BUSPAR) 15 mg tablet Take 15 mg by mouth twice daily.   ? dicyclomine (BENTYL) 10 mg capsule Take one capsule by mouth three times daily as needed.   ? fludrocortisone (FLORINEF) 0.1 mg tablet Take 0.1 mg by mouth daily.   ? mesalamine (CANASA) 1,000 mg supp Insert or Apply one suppository to rectal area as directed at bedtime daily.   ? Mesalamine 0.375 gram cp24 Take four capsules by mouth every morning.   ? midodrine (PROAMATINE) 2.5 mg tablet TAKE 1 TABLET BY MOUTH THREE TIMES DAILY AS NEEDED FOR LOW BLOOD PRESSURE. DO NOT LAY DOWN FOR 4 HOURS AFTER EACH DOSE   ?  PARoxetine (PAXIL) 10 mg tablet Daily     Vitals:    06/26/19 1521   Resp: 18   Temp: 36.8 ?C (98.2 ?F)   TempSrc: Temporal   Weight: 75.6 kg (166 lb 9.6 oz)   Height: 162.6 cm (64.02)   PainSc: Zero     Body mass index is 28.58 kg/m?Marland Kitchen     Physical Exam  General:  Not in acute distress  Lungs:  breathing comfortably.  Chest wall:  No tenderness or deformity.  Heart:   Regular rate and rhythm  Abdomen:  soft, non distended,non tender.  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 Allena Katz after his colonoscopy and pathology x2 were suggestive of inflammatory bowel disease.  Patient was initially seen in our clinic via Zoom on 09/16/2018 and most recently in 01/2019.  Today patient presents to clinic for a follow-up visit.    #Recurrent C. difficile infection:  -First diagnosed 04/07/2018, treated with a course of p.o. vancomycin with no response  -Tested positive again for C. difficile on 05/19/2018.  He was then treated with a 10 day course of fidaxomicin.  This decreased the number of his stools from 5?6/day to 3 a day but still mushy with occasional blood.  -Most recent positive C. difficile was on 06/24/2018 and patient eventually required F MT on 07/30/2018.   -Continue to be symptomatic with mushy diarrhea, infrequently bloody.  However this could be related to underlying inflammatory bowel disease #Inflammatory bowel disease:  -Patient developed GI symptoms of mucousy stools with occasional blood starting October 2019   -Was put on prednisone, Cipro and Flagyl by his PCP in January 2020  -Colonoscopy  07/30/2018 showed normal ileum and colon.  However random colon biopsies showed chronic active colitis with cryptitis, crypt abscesses negative for viral inclusions and dysplasia.   - Repeated colonoscopy 09/09/2018 showed altered vascular pattern with mild inflamed mucosa in the transverse colon, and granular mucosa in the rectum otherwise normal.  Small bowel also looked normal.  Biopsies from the TI were normal, biopsies from the transverse colon and rectum showed active colitis with cryptitis and crypt abscesses.  Biopsies from the rest of the colon were normal.    - EGD the same day 9/1 that showed normal esophagus dilated up to 18 mm, small hiatal hernia, otherwise normal.  -Family history of severe complicated Crohn's disease in  his maternal grandmother  - Most recent labs from 08/2018 showed mild anemia and increased eosinophils  -Started on mesalamine 09/16/2018 taking Lialda 4.8 g daily with partial clinical response response.  However complete endoscopic and histologic response based on most recent colonoscopy from 06/03/2019.  - Patient was seen at [facility] for second opinion and plan was to continue on Lialda.  He had repeated colonoscopy there 10/30/2018 that was normal.  Biopsies from the TI, right colon, left colon were all normal.  Biopsies from the rectum did show mild focal active chronic colitis.  -Most recent colonoscopy 06/03/2019 showed internal hemorrhoids but otherwise normal TI and normal colon.  Also biopsies showed no evidence of active disease.  -Extraintestinal manifestations of scleritis.  No other extraintestinal manifestations  -No IBD related malignancies  -Patient continues to have lower abdominal pain, mushy stools  and intermittent bleeding likely related to hemorrhoids. #POTS:   -Follows with cardiology    Plan:  ? Patient continues to havea mushy stools, with intermittent bleeding however repeated colonoscopy showed endoscopic and histologic remission.  Therefore his symptoms are less likely to be related to underlying IBD.  Also previous fecal calprotectin and CT enterography were normal.  ? Given his previous history of C. difficile infection we will repeat C. difficile PCR  ? Given the endoscopic remission, his symptoms would not be explained by active disease.  Rectal bleeding is likely related to hemorrhoids and therefore we will start him on Anusol cream twice daily.  Also for the mushy stools with lower abdominal pain will start patient on nortriptyline 10 mg nightly and if suboptimal response we can you increase to 25 mg nightly  ? Patient just had repeated labs in May in [place].  Records are not available patient will forward the results to [place].  ? Continue Lialda 4.8 g daily.  Okay to stop Canasa.  After 1 year we will consider to continue Lialda, decrease the dose to 2.4 g for maintenance.  ? Continue to avoid NSAIDs and narcotics.  Tylenol and tramadol okay for pain.  ? Avoid smoking  ? Follow-up accordingly    Return to clinic in 6 months    Patient was advised of the plan and voiced agreement and understanding.     This note was in part completed with Dragon, a voice to text dictation system. Some errors may have occured and persist despite my best efforts to edit this document to eliminate dictation/translation related errors.  If you have questions/concerns, please contact me for clarification.     Ramond Craver  Gastroenterology attending  Pager: [phone]

## 2019-06-26 NOTE — Patient Instructions
Recommendations:    -C. diff stool test.     -Anusol HC for hemorrhoids.     -Nortriptyline at bedtime. May increase to 25mg  if having a suboptimal response.     -We will follow-up with Pelvic Floor therapy to get you scheduled.     -Return to clinic in 6 months.

## 2019-06-29 ENCOUNTER — Encounter: Admit: 2019-06-29 | Discharge: 2019-06-29 | Payer: Private Health Insurance - Indemnity

## 2019-07-17 ENCOUNTER — Encounter: Admit: 2019-07-17 | Discharge: 2019-07-17 | Payer: Private Health Insurance - Indemnity

## 2019-07-20 ENCOUNTER — Encounter: Admit: 2019-07-20 | Discharge: 2019-07-20 | Payer: Private Health Insurance - Indemnity

## 2019-07-20 MED ORDER — MESALAMINE 800 MG PO TBEC
1.6 g | ORAL_TABLET | Freq: Three times a day (TID) | ORAL | 5 refills | 30.00000 days | Status: AC
Start: 2019-07-20 — End: ?

## 2019-09-25 ENCOUNTER — Encounter: Admit: 2019-09-25 | Discharge: 2019-09-25 | Payer: Private Health Insurance - Indemnity

## 2019-09-25 DIAGNOSIS — K5100 Ulcerative (chronic) pancolitis without complications: Secondary | ICD-10-CM

## 2019-10-01 ENCOUNTER — Encounter: Admit: 2019-10-01 | Discharge: 2019-10-01 | Payer: Private Health Insurance - Indemnity

## 2019-10-05 ENCOUNTER — Encounter: Admit: 2019-10-05 | Discharge: 2019-10-05 | Payer: Private Health Insurance - Indemnity

## 2019-10-08 ENCOUNTER — Encounter: Admit: 2019-10-08 | Discharge: 2019-10-08 | Payer: Private Health Insurance - Indemnity

## 2019-12-14 ENCOUNTER — Encounter: Admit: 2019-12-14 | Discharge: 2019-12-14 | Payer: Private Health Insurance - Indemnity

## 2020-02-02 ENCOUNTER — Encounter: Admit: 2020-02-02 | Discharge: 2020-02-02 | Payer: Private Health Insurance - Indemnity

## 2020-03-11 ENCOUNTER — Ambulatory Visit: Admit: 2020-03-11 | Discharge: 2020-03-12 | Payer: BC Managed Care – PPO

## 2020-03-11 ENCOUNTER — Encounter: Admit: 2020-03-11 | Discharge: 2020-03-11 | Payer: BC Managed Care – PPO

## 2020-03-11 DIAGNOSIS — K5100 Ulcerative (chronic) pancolitis without complications: Secondary | ICD-10-CM

## 2020-03-11 DIAGNOSIS — Z9289 Personal history of other medical treatment: Secondary | ICD-10-CM

## 2020-03-11 DIAGNOSIS — F419 Anxiety disorder, unspecified: Secondary | ICD-10-CM

## 2020-03-11 DIAGNOSIS — K5190 Ulcerative colitis, unspecified, without complications: Secondary | ICD-10-CM

## 2020-03-11 DIAGNOSIS — A498 Other bacterial infections of unspecified site: Secondary | ICD-10-CM

## 2020-03-11 DIAGNOSIS — I499 Cardiac arrhythmia, unspecified: Secondary | ICD-10-CM

## 2020-03-11 DIAGNOSIS — R197 Diarrhea, unspecified: Secondary | ICD-10-CM

## 2020-03-11 NOTE — Progress Notes
Date of Service: 03/11/2020    Subjective:             Jesse Peters is a 30 y.o. male.    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 Allena Katz after his colonoscopy and pathology x2 were suggestive of inflammatory bowel disease.  Patient was initially seen in our clinic via Zoom on 09/16/2018 and most recently 12/11/2019.  Today I video called the patient for a telemedicine follow-up visit.    Patient states that he started having GI symptoms in October 2019, mainly with mucousy stools with some abdominal cramps.  He only saw blood about 3 times when he was admitted to [facility] on December/05/2017 with bloody diarrhea and abdominal pain, treated symptomatically then he was a started on prednisone, ciprofloxacin and Flagyl by his PCP in January 2020.  Patient states that prednisone did help a little bit specifically in his lethargy and fatigue symptoms but also help with his bloody diarrhea.  Patient/2020 tested positive for C. difficile on 04/07/2018.  He was initially treated with a course of PO vancomycin, however, he continued to have unchanged diarrhea and repeat testing on 05/21/18 showed persistent C diff.  He was then treated with a 10 day course of fidaxomicin.  This decreased the number of his stools from 5?6/day to 3 a day but still mushy with occasional blood.  Most recent positive C. difficile was on 06/24/2018 and patient eventually required F MT on 07/30/2018.  Colonoscopy performed for FMT showed normal ileum and colon.  However random colon biopsies showed chronic active colitis with cryptitis, crypt abscesses negative for viral inclusions and dysplasia.  Patient continued to be symptomatic, having 3?5 bowel movements/day, occasionally bloody about once a week, associated with diffuse abdominal pain, stabbing, 7/10 in severity, without radiation, without precipitating or aggravating factors, with weight loss of 6 pounds but no melena, fevers, chills, or other GI symptoms.  Repeated colonoscopy 09/09/2018 showed altered vascular pattern with mild inflamed mucosa in the transverse colon, and granular mucosa in the rectum otherwise normal.  Small bowel also looked normal.  Biopsies from the TI were normal, biopsies from the transverse colon and rectum showed active colitis with cryptitis and crypt abscesses.  Biopsies from the rest of the colon were normal.  Patient also had EGD the same day 9/1 that showed normal esophagus dilated up to 18 mm, small hiatal hernia, otherwise normal.    After patient was seen in clinic 9/8 he was started on mesalamine Lialda 4.8 g daily, with some improvement slightly continues to have mushy stools and occasional lower abdominal pain. Due to persistent symptoms patient had repeated colonoscopy 06/03/2019 that showed internal hemorrhoids but otherwise normal TI and normal colon.  Also biopsies showed no evidence of active disease.  Patient then was switched from Lialda to Asacol HD 4.8 g daily due to insurance preference and he was not able to get medications for 4-5 days.  He had worsening symptoms over the summer including frequent stools, mushy stools with significant blood with the stool with more than a teaspoon of blood with bowel movements.  He had stool studies locally that were negative for infectious etiologies but positive for fecal leukocytes.  Plan was to restart Uceris that was declined by insurance so patient was eventually put  on prednisone 20 mg with slow taper and that led to improvement in his symptoms clinically however his symptoms worsened with steroid tapering so he was put back up on 20 mg daily on 02/16/2020 for 10 days then started to taper off and currently is down to 10 mg daily.    Patient continues to be symptomatic with rectal bleeding almost with every bowel movement together with significant urgency but has not had formed stool since end of December.  He remains on Asacol HD     Review of Systems   Constitutional: Negative.    HENT: Negative.    Eyes: Negative.    Respiratory: Negative.    Cardiovascular: Negative.    Gastrointestinal: Negative.    Endocrine: Negative.    Genitourinary: Negative.    Musculoskeletal: Negative.    Skin: Negative.    Allergic/Immunologic: Negative.    Neurological: Negative.    Hematological: Negative.    Psychiatric/Behavioral: Negative.          Objective:         ? atenoloL (TENORMIN) 25 mg tablet Take 1.5 tablets by mouth daily. (Patient taking differently: Take 37.5 mg by mouth daily. 1 and a half tablet daily)   ? busPIRone (BUSPAR) 15 mg tablet Take 15 mg by mouth twice daily.   ? dicyclomine (BENTYL) 10 mg capsule Take one capsule by mouth three times daily as needed.   ? fludrocortisone (FLORINEF) 0.1 mg tablet Take 0.1 mg by mouth daily.   ? hydrocortisone 2.5% (ANUSOL-HC) 2.5 % rectal cream To be used twice daily for hemorrhoids.   ? mesalamine (ASACOL HD) 800 mg tablet Take two tablets by mouth three times daily. Swallow whole; do not break, chew or crush.  Administer with or without food.   ? mesalamine (CANASA) 1,000 mg supp Insert or Apply one suppository to rectal area as directed at bedtime daily.   ? midodrine (PROAMATINE) 2.5 mg tablet TAKE 1 TABLET BY MOUTH THREE TIMES DAILY AS NEEDED FOR LOW BLOOD PRESSURE. DO NOT LAY DOWN FOR 4 HOURS AFTER EACH DOSE   ? nortriptyline (PAMELOR) 10 mg capsule Take one capsule by mouth at bedtime daily.   ? PARoxetine (PAXIL) 10 mg tablet Daily   ? predniSONE (DELTASONE) 20 mg tablet .COMPLEX     There were no vitals filed for this visit.  There is no height or weight on file to calculate BMI.     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 Allena Katz after his colonoscopy and pathology x2 were suggestive of inflammatory bowel disease.  Patient was initially seen in our clinic via Zoom on 09/16/2018 and most recently 12/11/2019.  Today I video called the patient for a telemedicine follow-up visit.    #Recurrent C. difficile infection:  -First diagnosed 04/07/2018, treated with a course of p.o. vancomycin with no response  -Tested positive again for C. difficile on 05/19/2018.  He was then treated with a 10 day course of fidaxomicin.-Still mushy with occasional blood.  -Most recent positive C. difficile was on 06/24/2018 and patient eventually required F MT on 07/30/2018.   -Continue to be symptomatic with mushy diarrhea, infrequently bloody.  However this could be related to underlying inflammatory bowel disease/irritable bowel  syndrome  -Repeat C. difficile PCR negative on 09/30/2019    #Inflammatory bowel disease:  -Patient developed GI symptoms of mucousy stools with occasional blood starting October 2019   -Was put on prednisone, Cipro and Flagyl by his PCP in January 2020  -Colonoscopy  07/30/2018 showed normal ileum and colon.  However random colon biopsies showed chronic active colitis with cryptitis, crypt abscesses negative for viral inclusions and dysplasia.   - Repeated colonoscopy 09/09/2018 showed altered vascular pattern with mild inflamed mucosa in the transverse colon, and granular mucosa in the rectum otherwise normal.  Small bowel also looked normal.  Biopsies from the TI were normal, biopsies from the transverse colon and rectum showed active colitis with cryptitis and crypt abscesses.  Biopsies from the rest of the colon were normal.    - EGD the same day 9/1 that showed normal esophagus dilated up to 18 mm, small hiatal hernia, otherwise normal.  -Family history of severe complicated Crohn's disease in  his maternal grandmother  - Most recent labs from 08/2018 showed mild anemia and increased eosinophils  -Started on mesalamine 09/16/2018 taking Lialda 4.8 g daily with partial clinical response response.  However complete endoscopic and histologic response based on most recent colonoscopy from 06/03/2019.  - Patient was seen at [facility] for second opinion and plan was to continue on Lialda.  He had repeated colonoscopy there 10/30/2018 that was normal.  Biopsies from the TI, right colon, left colon were all normal.  Biopsies from the rectum did show mild focal active chronic colitis.  -Most recent colonoscopy 06/03/2019 showed internal hemorrhoids but otherwise normal TI and normal colon.  Also biopsies showed no evidence of active disease.  -Extraintestinal manifestations of scleritis.  No other extraintestinal manifestations  -No IBD related malignancies  -Switched to Asacol HD per insurance preference and missed 4-5 days upon the switching time.  Currently back on 4.8 g daily of Asacol HD with some improvement in his symptoms but with prednisone 20 mg but when he tapered his prednisone down his symptoms come back  -Currently down to 10 mg daily of prednisone and reports almost bleeding with every bowel movement, frequent stools, with urgency.    #POTS:   -Follows with cardiology    Plan:  ? Patient continues to have symptoms suggestive of disease activity including frequency, loose stools together with significant bleeding however repeated colonoscopy showed endoscopic and histologic remission.  However his fecal leukocytes were positive this summer and patient has required multiple prednisone courses since then suggestive of poor response to mesalamine.  ? Patient has not performed fecal calprotectin.  We asked him to do it ASAP and if it is positive we will plan to step up to biologic therapies.  Discussed different classes of medications including anti-TNF versus anti-Integra versus antiinterleukin versus Jak inhibitors versus S1 P inhibitors.  Due to its safety profile patient prefers to start with vedolizumab and avoid anti-TNF at this time.  If not approved by insurance we will attempt ozanimod versus Stelara  ? If fecal calprotectin comes back normal we will plan to repeat colonoscopy to assess for endoscopic disease activity  ? If no evidence of inflammation with normal fecal calprotectin we will add treatment for potential superimposed IBS  ? Given his persistent rectal bleeding we will check iron studies together with repeat CBC and CMP  ? Continue to monitor labs closely including CBC and CMP every 3 months while on mesalamine products.  ? Continue to avoid NSAIDs and  narcotics.  Tylenol and tramadol okay for pain.  ? Avoid smoking  ? Follow-up accordingly    Return to clinic in 3 months    Patient was advised of the plan and voiced agreement and understanding.     This note was in part completed with Dragon, a voice to text dictation system. Some errors may have occured and persist despite my best efforts to edit this document to eliminate dictation/translation related errors.  If you have questions/concerns, please contact me for clarification.     Ramond Craver  Gastroenterology attending  Pager: [phone]

## 2020-03-14 ENCOUNTER — Encounter: Admit: 2020-03-14 | Discharge: 2020-03-14 | Payer: BC Managed Care – PPO

## 2020-03-15 ENCOUNTER — Encounter: Admit: 2020-03-15 | Discharge: 2020-03-15 | Payer: BC Managed Care – PPO

## 2020-03-18 ENCOUNTER — Encounter: Admit: 2020-03-18 | Discharge: 2020-03-18 | Payer: BC Managed Care – PPO

## 2020-03-18 DIAGNOSIS — K5100 Ulcerative (chronic) pancolitis without complications: Secondary | ICD-10-CM

## 2020-03-18 LAB — CALPROTECTIN, FECAL: Lab: 299

## 2020-03-18 MED ORDER — ACETAMINOPHEN 500 MG PO TAB
500 mg | Freq: Once | ORAL | 0 refills | Status: CN
Start: 2020-03-18 — End: ?

## 2020-03-18 MED ORDER — DIPHENHYDRAMINE HCL 25 MG PO CAP
25 mg | Freq: Once | ORAL | 0 refills | Status: CN
Start: 2020-03-18 — End: ?

## 2020-03-18 MED ORDER — VEDOLIZUMAB IVPB
300 mg | Freq: Once | INTRAVENOUS | 0 refills
Start: 2020-03-18 — End: ?

## 2020-03-18 NOTE — Telephone Encounter
Fecal calprotectin elevated. Discussed results with Dr. Horald Pollen. We will proceed with Entyvio. Orders placed. Patient notified via MyChart. Pharmacists will contact patient to discuss Entyvio.

## 2020-03-18 NOTE — Telephone Encounter
Lab results received via fax.  Scanned and routed to provider for review.

## 2020-03-24 ENCOUNTER — Encounter: Admit: 2020-03-24 | Discharge: 2020-03-24 | Payer: BC Managed Care – PPO

## 2020-03-24 DIAGNOSIS — K5100 Ulcerative (chronic) pancolitis without complications: Secondary | ICD-10-CM

## 2020-03-24 LAB — IRON + BINDING CAPACITY + %SAT+ FERRITIN
Lab: 110 FL (ref 80–100)
Lab: 278 g/dL (ref 32.0–36.0)
Lab: 28 pg (ref 26–34)
Lab: 30 % (ref 11–15)

## 2020-03-24 LAB — CBC AND DIFF
Lab: 1 K/UL (ref 1.0–4.8)
Lab: 10 % (ref 40–50)
Lab: 10 K/UL (ref 1.8–7.0)
Lab: 13 % (ref 4–12)
Lab: 32 FL (ref 7–11)
Lab: 32 K/UL — ABNORMAL LOW (ref 150–400)
Lab: 89 % (ref 0–5)
Lab: 9 % (ref >60)

## 2020-03-24 LAB — COMPREHENSIVE METABOLIC PANEL: Lab: 137 g/dL (ref 13.5–16.5)

## 2020-03-24 NOTE — Telephone Encounter
Lab results received via fax.  Scanned and routed to provider for review.

## 2020-03-25 ENCOUNTER — Encounter: Admit: 2020-03-25 | Discharge: 2020-03-25 | Payer: BC Managed Care – PPO

## 2020-03-25 MED ORDER — MESALAMINE 800 MG PO TBEC
1.6 g | ORAL_TABLET | Freq: Three times a day (TID) | ORAL | 0 refills | 30.00000 days | Status: AC
Start: 2020-03-25 — End: ?

## 2020-03-28 ENCOUNTER — Encounter: Admit: 2020-03-28 | Discharge: 2020-03-28 | Payer: BC Managed Care – PPO

## 2020-03-28 DIAGNOSIS — K5100 Ulcerative (chronic) pancolitis without complications: Secondary | ICD-10-CM

## 2020-03-28 NOTE — Telephone Encounter
Lenor Derrick, MD  Gorden Harms, RN  LFTs showed slightly elevated ALT, repeat LFTs in 2 weeks       Order placed for repeat LFTs. Patient notified order faxed to [facility].

## 2020-03-28 NOTE — Telephone Encounter
-----   Message from Lenor Derrick, MD sent at 03/25/2020 12:54 PM CDT -----  LFTs showed slightly elevated ALT, repeat LFTs in 2 weeks

## 2020-03-29 ENCOUNTER — Encounter: Admit: 2020-03-29 | Discharge: 2020-03-29 | Payer: BC Managed Care – PPO

## 2020-04-06 ENCOUNTER — Encounter: Admit: 2020-04-06 | Discharge: 2020-04-06 | Payer: BC Managed Care – PPO

## 2020-05-18 ENCOUNTER — Encounter: Admit: 2020-05-18 | Discharge: 2020-05-18 | Payer: BC Managed Care – PPO

## 2020-05-18 NOTE — Telephone Encounter
Amil Amen, RN hand delivered [place] orders to be scanned into pts chart.  Routing to be scanned into pts chart for suture reference.

## 2020-05-23 ENCOUNTER — Encounter: Admit: 2020-05-23 | Discharge: 2020-05-23 | Payer: BC Managed Care – PPO

## 2020-05-23 ENCOUNTER — Ambulatory Visit: Admit: 2020-05-23 | Discharge: 2020-05-24 | Payer: BC Managed Care – PPO

## 2020-05-23 DIAGNOSIS — K5100 Ulcerative (chronic) pancolitis without complications: Principal | ICD-10-CM

## 2020-05-23 DIAGNOSIS — F419 Anxiety disorder, unspecified: Secondary | ICD-10-CM

## 2020-05-23 DIAGNOSIS — I499 Cardiac arrhythmia, unspecified: Secondary | ICD-10-CM

## 2020-05-23 DIAGNOSIS — K5190 Ulcerative colitis, unspecified, without complications: Secondary | ICD-10-CM

## 2020-05-23 MED ORDER — VEDOLIZUMAB IVPB
300 mg | Freq: Once | INTRAVENOUS | 0 refills | Status: CP
Start: 2020-05-23 — End: ?
  Administered 2020-05-23 (×2): 300 mg via INTRAVENOUS

## 2020-05-23 MED ORDER — VEDOLIZUMAB IVPB
300 mg | Freq: Once | INTRAVENOUS | 0 refills
Start: 2020-05-23 — End: ?

## 2020-05-23 MED ORDER — VEDOLIZUMAB IVPB
300 mg | Freq: Once | INTRAVENOUS | 0 refills | Status: CN
Start: 2020-05-23 — End: ?

## 2020-05-23 NOTE — Patient Instructions
Entyvio Injection 300 mg    Uses  For inflammatory bowel disease.    Instructions  This medicine will be given to you at the hospital or infusion center.  This medicine is given as an IV injection into a vein.  This medicine is given gradually through the IV line.  This medicine should be given over 30 minutes.  Always inspect the medicine before using.  Check the medicine before each use. If the liquid medicine has any particles in it, appears discolored, or if the vial appears damaged, do not use it.  Keep this medicine in the refrigerator. Do not freeze.  Protect medicine from light.  This medicine should be given by a trained health care provider.  It is important that you keep taking each dose of this medicine on time even if you are feeling well.  If you miss a dose, contact your doctor for instructions.  Please tell your doctor and pharmacist about all the medicines you take. Include both prescription and over-the-counter medicines. Also tell them about any vitamins, herbal medicines, or anything else you take for your health.  Talk to your doctor before taking other medicines, including aspirins and ibuprofen containing products. Speak to your doctor about which medicines are safe to use while you are on this medicine.  Do not suddenly stop taking this medicine. Check with your doctor before stopping.  It is very important that you follow your doctor's instructions for all blood tests.  It is very important that you keep all appointments for medical exams and tests while on this medicine.    Cautions  Tell your doctor and pharmacist if you ever had an allergic reaction to a medicine. Symptoms of an allergic reaction can include trouble breathing, skin rash, itching, swelling, or severe dizziness.  This medicine is associated with a rare but very serious medical condition. Please speak with your doctor about symptoms you should look out for while on this medicine. Notify your doctor immediately if you  develop those symptoms.  Some patients on this medicine have developed severe, life-threatening infections. Please speak with your doctor about the risks and benefits of using this medicine.  Some patients taking this medicine have experienced serious side effects. Please speak with your doctor to understand the risks and benefits associated with this medicine.  This medicine is associated with a rare, but serious problem of the liver. Speak to your doctor about the early signs of liver problems and the benefits and risks of using this medicine.  Call the doctor if there are any signs of confusion or unusual changes in behavior.  Family should check on the patient often. Call the doctor if patient becomes more depressed, has thoughts of suicide, or shows changes in behavior.  This medicine may reduce your body's ability to fight infections. Try to avoid contact with people with colds, flu or other infections.  Contact your doctor if you develop any signs of a new infection such as fever, cough, sore throat, or chills.  Wash your hands often and avoid close contact with people with infections such as colds and flu.  Speak with your health care provider before receiving any vaccinations.  Tell the doctor or pharmacist if you are pregnant, planning to be pregnant, or breastfeeding.  Ask your pharmacist if this medicine can interact with any of your other medicines. Be sure to tell them about all the medicines you take.  Please tell all your doctors and dentists that you are on this   medicine before they provide care.  Do not start or stop any other medicines without first speaking to your doctor or pharmacist.  This medicine is associated with increased risk of death in certain patients. Please speak with your doctor about the benefits and risks of using this medicine.  This medicine can cause serious side effects in some patients. Important information from the [place] Food and Drug Administration (FDA) is available from  your pharmacist. Please review it carefully with your pharmacist to understand the risks associated with this medicine.    Side Effects  The following is a list of some common side effects from this medicine. Please speak with your doctor about what you should do if you experience these or other side effects.  swelling of the legs, feet, and hands  headaches  reaction at the area of the injection (pain, redness, swelling)  pain in the joints  Call your doctor or get medical help right away if you notice any of these more serious side effects:  severe abdominal or pelvic pain  loss of balance  chest pain  difficulty concentrating  confusion  cough that does not go away  depression or feeling sad  dizziness  feeling of heat or flushing  fever or chills  severe or persistent headache  fast or irregular heart beats  symptoms of liver damage (such as yellowing of skin or eyes, dark urine, unusual tiredness or weakness; severe stomach or back pain)  memory problems or loss  mood changes  tight or rigid muscles  nausea  neck pain or stiffness  seizures  shortness of breath  sore throat  difficulty speaking  suicidal thoughts  unusual or unexplained tiredness or weakness  unsteadiness while walking  upper respiratory infection  dark urine  blurring or changes of vision  A few people may have an allergic reaction to this medicine. Symptoms can include difficulty breathing, skin rash, itching, swelling, or severe dizziness. If you notice any of these symptoms, seek medical help quickly.    Extra  Please speak with your doctor, nurse, or pharmacist if you have any questions about this medicine.  [email or web address]  IMPORTANT NOTE: This document tells you briefly how to take your medicine, but it does not tell you all there is to know about it.Your doctor or pharmacist may give you other documents about your medicine. Please talk to them if you have any questions.Always follow their advice. There is  a more complete description of this medicine available in English.Scan this code on your smartphone or tablet or use the web address below. You can also ask your pharmacist for a printout. If you have any questions, please ask your pharmacist.       2022 [employer]

## 2020-05-23 NOTE — Progress Notes
Pt tolerated infusion.  Post infusion emergency medical treatment: Go to the closest Emergency Department or call 911. For non-urgent questions or concerns, call [phone] after 0900 the following day(including weekends & holidays). For urgent medical questions, call [phone] and ask to speak to the On-Call Physician covering for the physician prescribing your infusion medication.

## 2020-06-06 IMAGING — CT CT chest PE
3 series · 19 of 30 positions shown · IV contrast (OMNIPAQUE 350)
Comparison: none

[Series 2: cta chest pe · axial · 0.57mm/px · z∈[-220,-20]mm · 7 of 57 slices shown]
[im 9/57  lung]
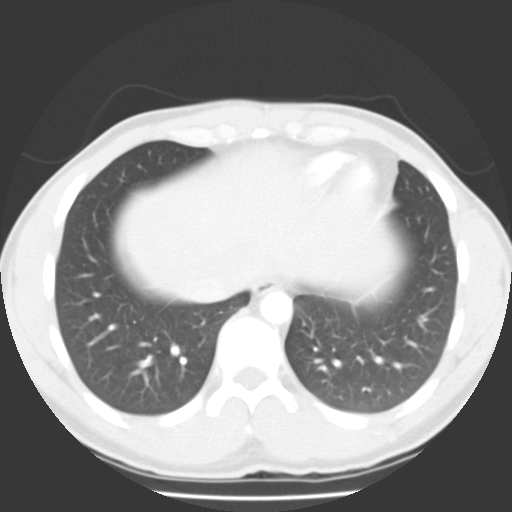
[im 17/57  mediastinal]
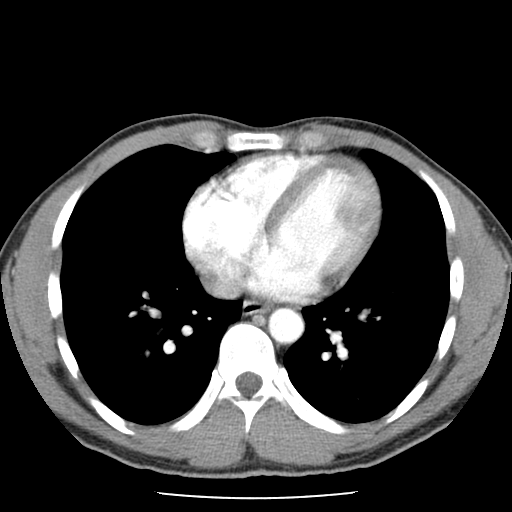
[im 25/57  lung]
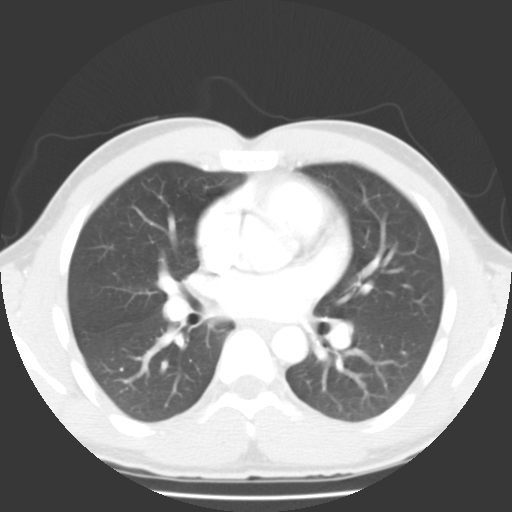
[im 28/57  mediastinal]
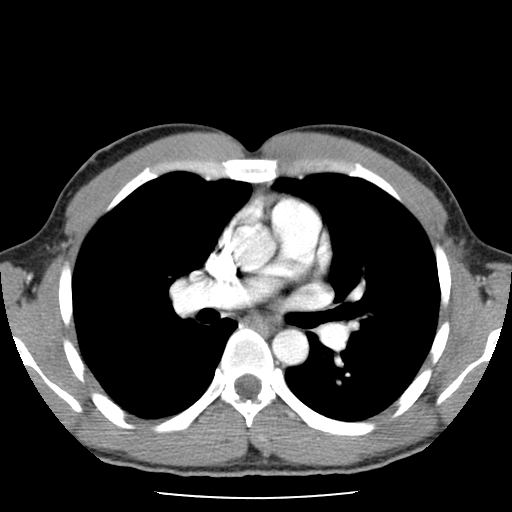
[im 33/57  lung]
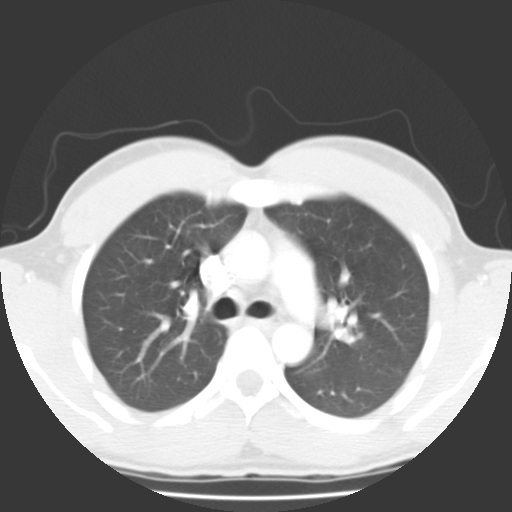
[im 41/57  mediastinal]
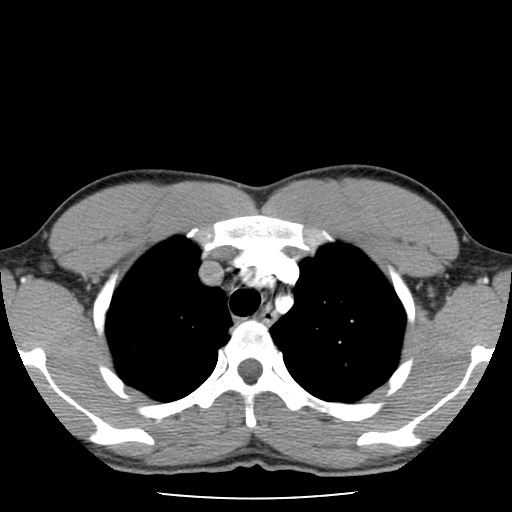
[im 49/57  lung]
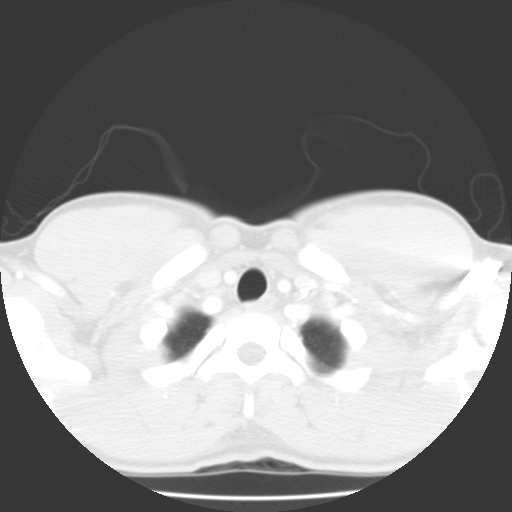

[Series 400: coronal · coronal · 0.57mm/px · 4 of 67 slices shown]
[im 10/67  lung]
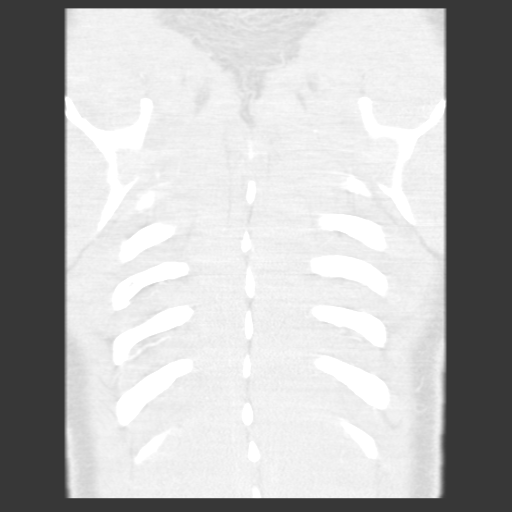
[im 19/67  lung]
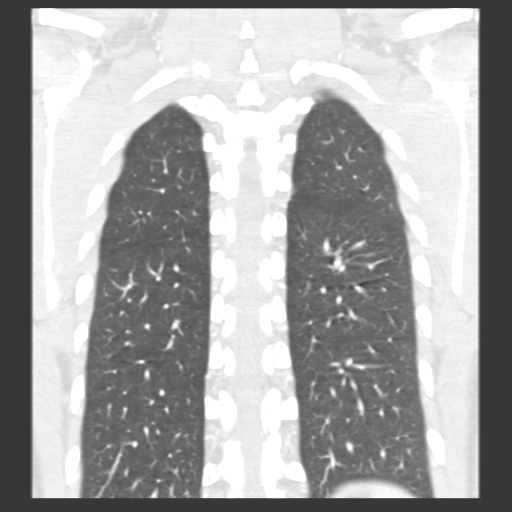
[im 29/67  lung]
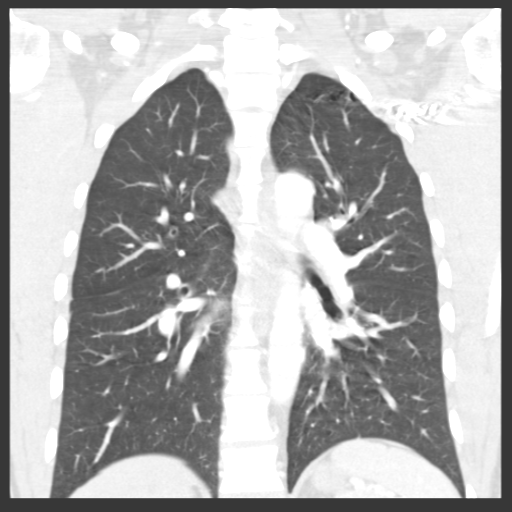
[im 38/67  lung]
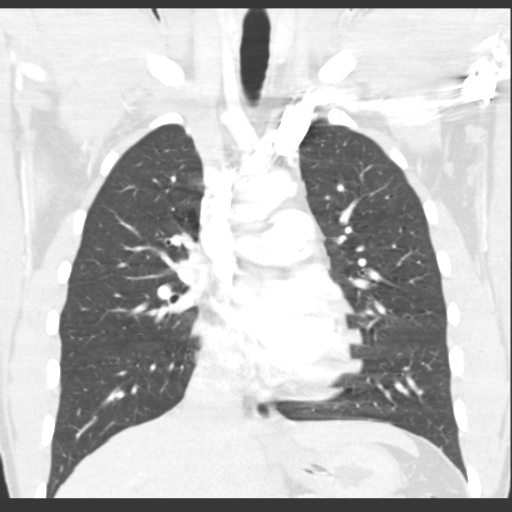

[Series 401: sagittal · sagittal · 0.57mm/px · 8 of 98 slices shown]
[im 9/98  lung]
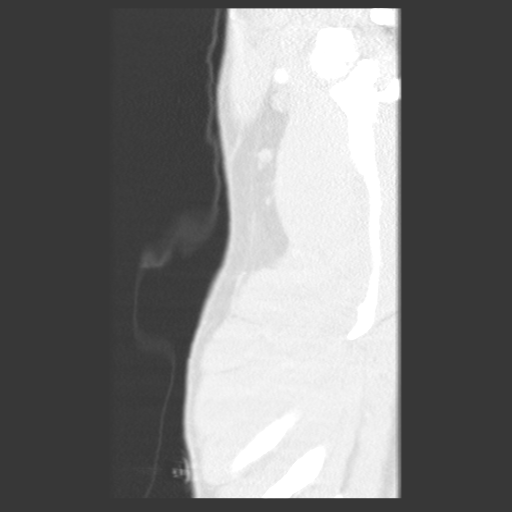
[im 27/98  lung]
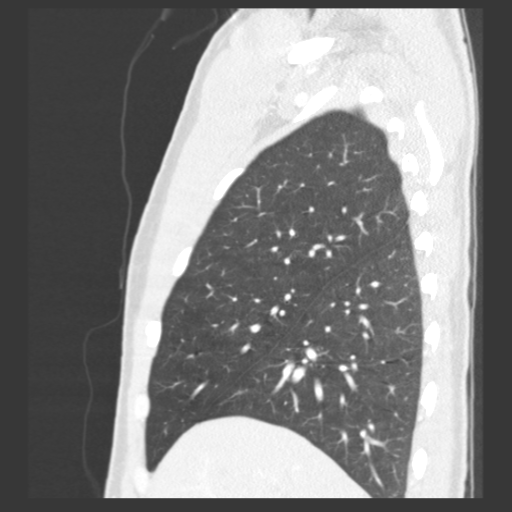
[im 36/98  lung]
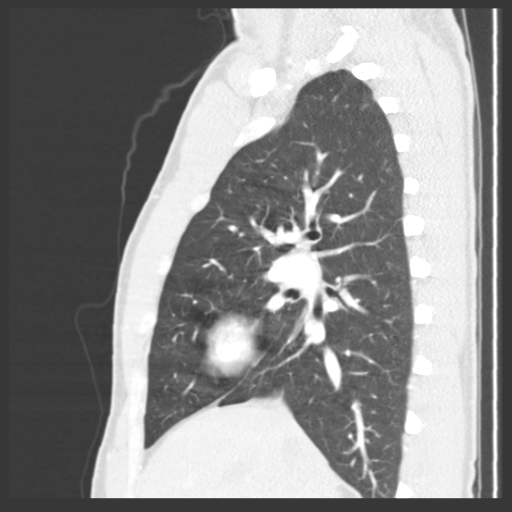
[im 45/98  lung]
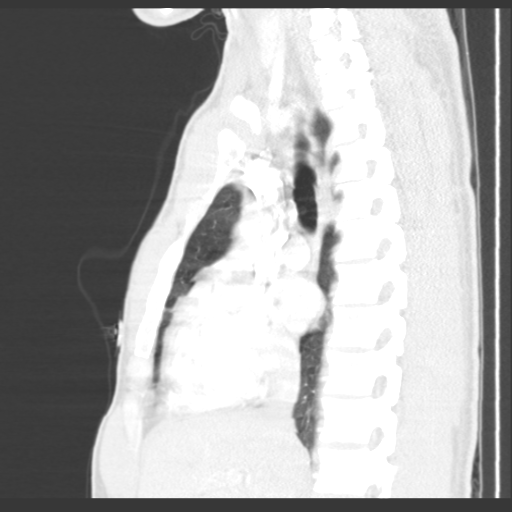
[im 53/98  lung]
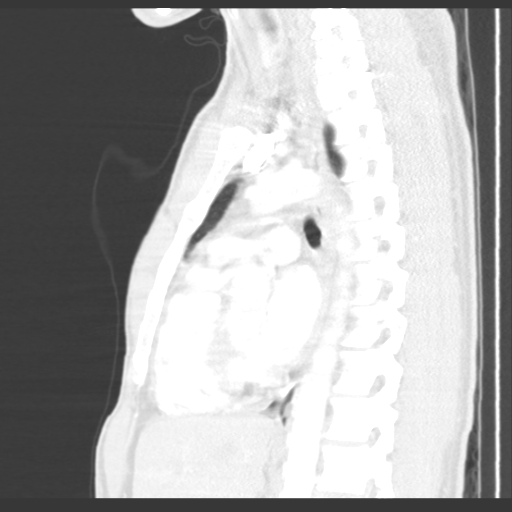
[im 62/98  lung]
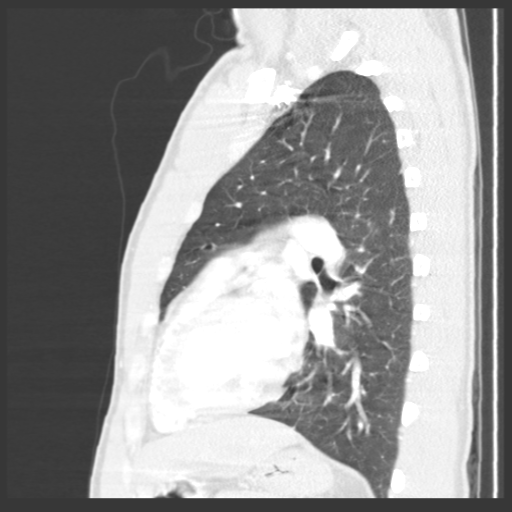
[im 71/98  lung]
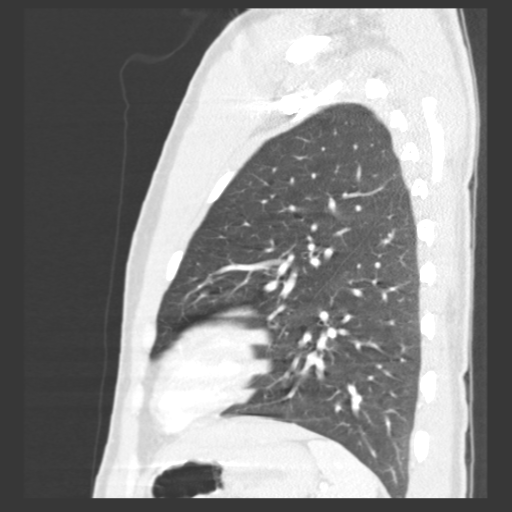
[im 89/98  lung]
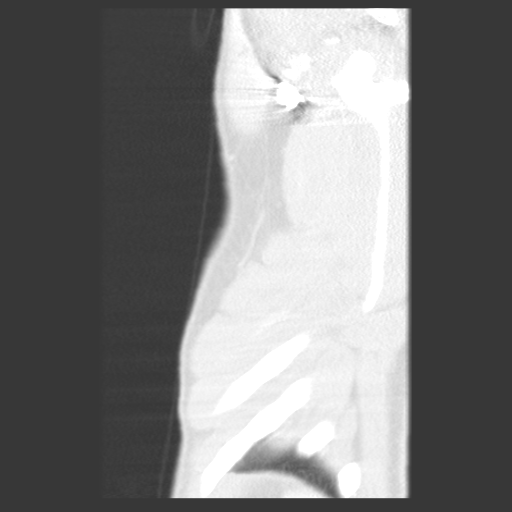

[19 of 30 positions shown; findings below may reference images not displayed]

EXAM

CT CHEST

INDICATION

Pain

TECHNIQUE

CT of the chest was performed. All CT scans at this facility use dose modulation, iterative
reconstruction, and/or weight based dosing when appropriate to reduce radiation dose to as low as
reasonably achievable.

COMPARISONS

None available at the time of dictation.

FINDINGS

Pulmonary arteries: No suspicious filling defect. No right ventricular heart strain or elevated
right heart pressures.

Lung parenchyma: No suspicious focal airspace opacity, nodule, or consolidation.

Pleural space: No pleural effusion or pneumothorax.

Central Airways: Patent

Cardiovascular: Normal cardiac size.  No pericardial effusion. Mild coronary artery calcifications.
Normal caliber of the great vessels.

Lower neck: Unremarkable

Lymphadenopathy: Multiple non-pathologically enlarged mediastinal lymph nodes.

Upper abdomen: Unremarkable

Musculoskeletal: No acute fracture or aggressive focal osseous lesion.

IMPRESSION

No CT evidence of an acute pulmonary embolism.

Tech Notes:
OMNIPAQUE 300

## 2020-06-10 ENCOUNTER — Encounter: Admit: 2020-06-10 | Discharge: 2020-06-10 | Payer: BC Managed Care – PPO

## 2020-06-10 ENCOUNTER — Ambulatory Visit: Admit: 2020-06-10 | Discharge: 2020-06-11 | Payer: BC Managed Care – PPO

## 2020-06-10 DIAGNOSIS — I499 Cardiac arrhythmia, unspecified: Secondary | ICD-10-CM

## 2020-06-10 DIAGNOSIS — F419 Anxiety disorder, unspecified: Secondary | ICD-10-CM

## 2020-06-10 DIAGNOSIS — K5100 Ulcerative (chronic) pancolitis without complications: Secondary | ICD-10-CM

## 2020-06-10 DIAGNOSIS — K5190 Ulcerative colitis, unspecified, without complications: Secondary | ICD-10-CM

## 2020-06-10 MED ORDER — VEDOLIZUMAB IVPB
300 mg | Freq: Once | INTRAVENOUS | 0 refills | Status: CN
Start: 2020-06-10 — End: ?

## 2020-06-10 MED ORDER — VEDOLIZUMAB IVPB
300 mg | Freq: Once | INTRAVENOUS | 0 refills
Start: 2020-06-10 — End: ?

## 2020-06-10 MED ORDER — VEDOLIZUMAB IVPB
300 mg | Freq: Once | INTRAVENOUS | 0 refills | Status: CP
Start: 2020-06-10 — End: ?
  Administered 2020-06-10 (×2): 300 mg via INTRAVENOUS

## 2020-06-10 NOTE — Progress Notes
Patient tolerated infusion; no reaction noted.

## 2020-06-20 ENCOUNTER — Encounter: Admit: 2020-06-20 | Discharge: 2020-06-20 | Payer: BC Managed Care – PPO

## 2020-06-20 NOTE — Telephone Encounter
Attempted to call Rosine Beat to verify compliance and assess tolerance of his specialty medications [facility]er). No answer. Left voicemail asking patient to return call to pharmacist at [phone].    Benedetto Coons, PHARMD

## 2020-07-05 ENCOUNTER — Ambulatory Visit: Admit: 2020-07-05 | Discharge: 2020-07-06 | Payer: BC Managed Care – PPO

## 2020-07-05 ENCOUNTER — Encounter: Admit: 2020-07-05 | Discharge: 2020-07-05 | Payer: BC Managed Care – PPO

## 2020-07-05 DIAGNOSIS — R195 Other fecal abnormalities: Secondary | ICD-10-CM

## 2020-07-05 DIAGNOSIS — F419 Anxiety disorder, unspecified: Secondary | ICD-10-CM

## 2020-07-05 DIAGNOSIS — Z79899 Other long term (current) drug therapy: Secondary | ICD-10-CM

## 2020-07-05 DIAGNOSIS — Z5181 Encounter for therapeutic drug level monitoring: Secondary | ICD-10-CM

## 2020-07-05 DIAGNOSIS — K5100 Ulcerative (chronic) pancolitis without complications: Secondary | ICD-10-CM

## 2020-07-05 DIAGNOSIS — I498 Other specified cardiac arrhythmias: Secondary | ICD-10-CM

## 2020-07-05 DIAGNOSIS — K5190 Ulcerative colitis, unspecified, without complications: Secondary | ICD-10-CM

## 2020-07-05 DIAGNOSIS — I499 Cardiac arrhythmia, unspecified: Secondary | ICD-10-CM

## 2020-07-05 NOTE — Progress Notes
Telehealth Visit Note    Date of Service: 07/05/2020    Subjective:      Obtained patient's verbal consent to treat them and their agreement to [facility] financial policy and NPP via this telehealth visit during the [facility] Emergency       Jesse Peters is a 30 y.o. male.    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 Allena Katz after his colonoscopy and pathology x2 were suggestive of inflammatory bowel disease.  Patient was initially seen in our clinic via Zoom on 09/16/2018 and most recently 03/11/2020.  Today I video called the patient for a telemedicine follow-up visit.    Patient states that he started having GI symptoms in October 2019, mainly with mucousy stools with some abdominal cramps.  He only saw blood about 3 times when he was admitted to [facility] on December/05/2017 with bloody diarrhea and abdominal pain, treated symptomatically then he was a started on prednisone, ciprofloxacin and Flagyl by his PCP in January 2020.  Patient states that prednisone did help a little bit specifically in his lethargy and fatigue symptoms but also help with his bloody diarrhea.  Patient/2020 tested positive for C. difficile on 04/07/2018.  He was initially treated with a course of PO vancomycin, however, he continued to have unchanged diarrhea and repeat testing on 05/21/18 showed persistent C diff.  He was then treated with a 10 day course of fidaxomicin.  This decreased the number of his stools from 5-6/day to 3 a day but still mushy with occasional blood.  Most recent positive C. difficile was on 06/24/2018 and patient eventually required F MT on 07/30/2018.  Colonoscopy performed for FMT showed normal ileum and colon.  However random colon biopsies showed chronic active colitis with cryptitis, crypt abscesses negative for viral inclusions and dysplasia.  Patient continued to be symptomatic, having 3-5 bowel movements/day, occasionally bloody about once a week, associated with diffuse abdominal pain, stabbing, 7/10 in severity, without radiation, without precipitating or aggravating factors, with weight loss of 6 pounds but no melena, fevers, chills, or other GI symptoms.  Repeated colonoscopy 09/09/2018 showed altered vascular pattern with mild inflamed mucosa in the transverse colon, and granular mucosa in the rectum otherwise normal.  Small bowel also looked normal.  Biopsies from the TI were normal, biopsies from the transverse colon and rectum showed active colitis with cryptitis and crypt abscesses.  Biopsies from the rest of the colon were normal.  Patient also had EGD the same day 9/1 that showed normal esophagus dilated up to 18 mm, small hiatal hernia, otherwise normal.    After patient was seen in clinic 9/8 he was started on mesalamine Lialda 4.8 g daily, with some improvement slightly continues to have mushy stools and occasional lower abdominal pain. Due to persistent symptoms patient had repeated colonoscopy 06/03/2019 that showed internal hemorrhoids but otherwise normal TI and normal colon.  Also biopsies showed no evidence of active disease.  Patient then was switched from Lialda to Asacol HD 4.8 g daily due to insurance preference and he was not able to get medications for 4-5 days.  He had worsening symptoms over the summer including frequent stools, mushy stools with significant blood with the stool with more than a teaspoon of blood with bowel movements.  He  had stool studies locally that were negative for infectious etiologies but positive for fecal leukocytes.  Plan was to restart Uceris that was declined by insurance so patient was eventually put on prednisone 20 mg with slow taper and that led to improvement in his symptoms clinically however his symptoms worsened with steroid tapering so he was put back up on 20 mg daily on 02/16/2020 for 10 days then tapered it off completely.    Interval history: When seen last clinic visit patient was symptomatic with rectal bleeding almost with every bowel movement together with significant urgency.  Fecal calprotectin also went up to 299 on 03/14/2020.  Treatment options were discussed and mutual decision was to start patient on Entyvio which was started in April and patient has received 2 doses so far, scheduled for his third and last induction dose this Friday followed by maintenance.    Clinically patient reports dramatic improvement in his symptoms following initiation of interview.  Denies any changes in bowel habit, constipation, diarrhea, rectal bleeding, hematochezia, or systemic symptoms including fevers, chills, weight or appetite loss, or extraintestinal manifestations including joint pain, joint swelling or vision problems.  Only symptoms patient reported today include intermittent abdominal cramps related to food usually together with mild rash over the right chest but today I could not see it over Zoom due to being mild.  No other symptoms.    Most recent labs performed on 06/10/2020 showed CBC: normal, CMP: Normal, CRP: normal      Medical History:   Diagnosis Date   ? Anxiety    ? Arrhythmia    ? UC (ulcerative colitis) [facility])        Surgical History:   Procedure Laterality Date   ? PREPARATION FECAL MICROBIOTA FOR INSTILLATION WITH ASSESSMENT DONOR SPECIMEN, administered via colonoscopy N/A 07/30/2018    Performed by Lenor Derrick, MD at [facility] ENDO   ? COLONOSCOPY VIA STOMA WITH BIOPSY  07/30/2018    Performed by Lenor Derrick, MD at [facility] ENDO   ? ESOPHAGOGASTRODUODENOSCOPY WITH BIOPSY - FLEXIBLE N/A 09/09/2018    Performed by Samuel Jester, MD at [facility] ENDO   ? COLONOSCOPY WITH BIOPSY - FLEXIBLE  09/09/2018    Performed by Samuel Jester, MD at [facility] ENDO   ? HEART CATHETERIZATION  10/2018    [facility]   ? ESOPHAGEAL MOTILITY STUDY N/A 01/08/2019    Performed by Meyer Cory, MD at [facility] ENDO   ? Colonoscopy N/A 06/03/2019    Performed by Buckles, Vinnie Level, MD at [facility] OR       Social History     Socioeconomic History   ? Marital status: Married   Occupational History   ? Occupation: Runner, broadcasting/film/video   Tobacco Use   ? Smoking status: Never Smoker   ? Smokeless tobacco: Former Neurosurgeon     Types: Musician Use   ? Vaping Use: Unknown   Substance and Sexual Activity   ? Alcohol use: Yes     Comment: Minimal   ? Drug use: Not Currently     Frequency: 2.0 times per week     Types: Marijuana            Review of Systems   Constitutional: Negative.    HENT: Negative.    Eyes: Negative.    Respiratory: Negative.    Cardiovascular: Negative.    Gastrointestinal: Negative.    Endocrine: Negative.    Genitourinary: Negative.    Musculoskeletal: Negative.  Skin: Negative.    Allergic/Immunologic: Negative.    Neurological: Negative.    Hematological: Negative.    Psychiatric/Behavioral: Negative.    All other systems reviewed and are negative.        Objective:         ? atenoloL (TENORMIN) 25 mg tablet Take 1.5 tablets by mouth daily. (Patient taking differently: Take 37.5 mg by mouth daily. 1 and a half tablet daily)   ? busPIRone (BUSPAR) 15 mg tablet Take 15 mg by mouth twice daily.   ? dicyclomine (BENTYL) 10 mg capsule Take one capsule by mouth three times daily as needed.   ? fludrocortisone (FLORINEF) 0.1 mg tablet Take 0.1 mg by mouth daily.   ? hydrocortisone 2.5% (ANUSOL-HC) 2.5 % rectal cream To be used twice daily for hemorrhoids.   ? mesalamine (ASACOL HD) 800 mg tablet Take two tablets by mouth three times daily. Swallow whole; do not break, chew or crush.  Administer with or without food.   ? mesalamine (CANASA) 1,000 mg supp Insert or Apply one suppository to rectal area as directed at bedtime daily.   ? midodrine (PROAMATINE) 2.5 mg tablet TAKE 1 TABLET BY MOUTH THREE TIMES DAILY AS NEEDED FOR LOW BLOOD PRESSURE. DO NOT LAY DOWN FOR 4 HOURS AFTER EACH DOSE   ? nortriptyline (PAMELOR) 10 mg capsule Take one capsule by mouth at bedtime daily.   ? PARoxetine (PAXIL) 10 mg tablet Daily   ? 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 Allena Katz after his colonoscopy and pathology x2 were suggestive of inflammatory bowel disease.  Patient was initially seen in our clinic via Zoom on 09/16/2018 and most recently 03/11/2020.  Today I video called the patient for a telemedicine follow-up visit.    #Recurrent C. difficile infection:  -First diagnosed 04/07/2018, treated with a course of p.o. vancomycin with no response  -Tested positive again for C. difficile on 05/19/2018.  He was then treated with a 10 day course of fidaxomicin.-Still mushy with occasional blood.  -Most recent positive C. difficile was on 06/24/2018 and patient eventually required F MT on 07/30/2018.   -Continue to be symptomatic with mushy diarrhea, infrequently bloody.  However this could be related to underlying inflammatory bowel disease/irritable bowel syndrome  -Repeat C. difficile PCR negative on 09/30/2019    #Inflammatory bowel disease:  -Patient developed GI symptoms of mucousy stools with occasional blood starting October 2019   -Treated with prednisone, Cipro and Flagyl by his PCP in January 2020  -Colonoscopy  07/30/2018 showed normal ileum and colon.  However random colon biopsies showed chronic active colitis with cryptitis, crypt abscesses negative for viral  inclusions and dysplasia.   - Repeated colonoscopy 09/09/2018 showed altered vascular pattern with mild inflamed mucosa in the transverse colon, and granular mucosa in the rectum otherwise normal.  Small bowel also looked normal.  Biopsies from the TI were normal, biopsies from the transverse colon and rectum showed active colitis with cryptitis and crypt abscesses.  Biopsies from the rest of the colon were normal.    - EGD the same day 9/1 that showed normal esophagus dilated up to 18 mm, small hiatal hernia, otherwise normal.  -Family history of severe complicated Crohn's disease in  his maternal grandmother  - Most recent labs from 08/2018 showed mild anemia and increased eosinophils  -Started on mesalamine 09/16/2018 taking Lialda 4.8 g daily with partial clinical response response.  However complete endoscopic and histologic response based on most recent colonoscopy from 06/03/2019.  - Patient was seen at [facility] for second opinion and plan was to continue on Lialda.  He had repeated colonoscopy there 10/30/2018 that was normal.  Biopsies from the TI, right colon, left colon were all normal.  Biopsies from the rectum did show mild focal active chronic colitis.  -Most recent colonoscopy 06/03/2019 showed internal hemorrhoids but otherwise normal TI and normal colon.  Also biopsies showed no evidence of active disease.  -Extraintestinal manifestations of scleritis.  No other extraintestinal manifestations  -No IBD related malignancies  -Switched to Asacol HD per insurance preference and missed 4-5 days upon the switching time.  Currently back on 4.8 g daily of Asacol HD with some improvement in his symptoms but with prednisone 20 mg but when he tapered his prednisone down his symptoms come back  -Started on Entyvio April 2022, received 2 doses and scheduled to complete the induction phase this Friday.  -Reports dramatic clinical response to [facility] with no more symptoms except for intermittent abdominal cramps mostly related to food.    #Chronic high risk medication use [biologic therapy]:  -Started on Entyvio April 2022, scheduled to complete induction this Friday followed by maintenance.    #Elevated fecal calprotectin:  -Fecal calprotectin elevated 299 on 03/14/2020    #POTS:   -Follows with cardiology    Plan:  ? Patient reports very good clinical response to [facility].  Therefore we will continue induction followed by maintenance every 8 weeks  ? Repeat labs with the next infusion on 7/1 including CBC, CMP, and fecal calprotectin then again in 2 months  ? Plan to repeat colonoscopy in December 2022 to evaluate endoscopic and histologic response to [facility]  ? Refer patient to dermatology given the new but mild rash he reports over the right chest, together with annual dermatology exam while on biologic therapies  ? Continue to avoid NSAIDs and narcotics.  Tylenol and tramadol okay for pain.  ? Avoid smoking  ? Follow-up accordingly    Biologic therapy (High risk medications):    ? These medications are very powerful medication used to treat inflammatory bowel disease. However, a considerable number of patients with Crohn's disease and ulcerative colitis do not have a clinically relevant response to currently available biologic therapies (primary treatment failure).  ? Skin cancer risk is increased with some of the biologic therapies.  Therefore patients need to wear sun screen and undergo a full body skin cancer check each year by a Dermatologist.   ?   ? The following assessments should be performed before the initiation of treatment and at regular intervals (every 8 to 12 weeks) during treatment: urinalysis, a complete  blood count, and measurement of levels of acute-phase reactants (e.g., C-reactive protein), creatinine, and electrolytes.   ? Measurement of liver enzyme levels should be performed every 2 weeks in the first 1 to 2 months of treatment and every 8 to 12 weeks thereafter. In addition, patients should be monitored carefully for symptoms and signs suggestive of cancer or the worsening of a coexisting illness, such as congestive heart failure or diabetes.  ?   ? Pt will need to contact primary physician's office if develops any signs of an infection.  If they have a suspected infection or are being treated for a current infection Patients CAN NOT take this medication until the infection has completely resolved.  ? Patient has to Contact my office if develops persistent fevers, chills, night sweats or swollen lymph nodes or glands.  ? Patient should NOT receive any LIVE VIRUS VACCINES [live vaccines include: nasal influenza, MMR, Varicella-Zoster (chicken pox vaccines), Rotavirus].  Shingrix vaccine CAN be taken.   ? Patient will need a yearly influenza vaccine ( NOT the nasal influenza vaccine; only receive the shot).  ? Patient will need a pneumonia vaccination; then will also need pneumonia vaccine booster in 5 years.  If never received this vaccination, it has to be received, needs to contact PCP for that.  ? Male Patient has to discuss HPV vaccination with primary physician or gynecologist, beside updated pap smear.     IBD Health Maintenance: Discussed with the patient today  1. Influenza vaccine: Pt should undergo annual flu vaccine.  2. Pneumonia vaccine: Pt should receive PCV-13 followed by PPSV-23 at least 2 months apart, then a booster Q5yeaars  3. Dermatology: Recommended annual skin exam by dermatology  4. Gyne: NA  5. Bone density: Consider to check if on steroids for > 4 weeks  6. Tobacco use: No  7. Surveillance colonoscopy:  Planned for December 2022  8. Biologic use:  Entyvio  9. Extraintestinal manifestations: Scleritis  10. Malignancy:  11. COVID-19 vaccine: Recommended    Return to clinic in 4 months with Adam Case, NP, 7 months with me.    Patient was advised of the plan and voiced agreement and understanding.     This note was in part completed with Dragon, a voice to text dictation system. Some errors may have occured and persist despite my best efforts to edit this document to eliminate dictation/translation related errors.  If you have questions/concerns, please contact me for clarification.     Ramond Craver  Gastroenterology attending  Pager: [phone]

## 2020-07-05 NOTE — Telephone Encounter
Faxed fecal calprotectin order to [facility] per patient preference. Labs will drawn with [facility] of Entyvio.

## 2020-07-08 ENCOUNTER — Encounter: Admit: 2020-07-08 | Discharge: 2020-07-08 | Payer: BC Managed Care – PPO

## 2020-07-08 DIAGNOSIS — I499 Cardiac arrhythmia, unspecified: Secondary | ICD-10-CM

## 2020-07-08 DIAGNOSIS — F419 Anxiety disorder, unspecified: Secondary | ICD-10-CM

## 2020-07-08 DIAGNOSIS — K5190 Ulcerative colitis, unspecified, without complications: Secondary | ICD-10-CM

## 2020-07-08 MED ORDER — VEDOLIZUMAB IVPB
300 mg | Freq: Once | INTRAVENOUS | 0 refills | Status: CN
Start: 2020-07-08 — End: ?

## 2020-07-08 MED ORDER — VEDOLIZUMAB IVPB
300 mg | Freq: Once | INTRAVENOUS | 0 refills | Status: CP
Start: 2020-07-08 — End: ?
  Administered 2020-07-08 (×2): 300 mg via INTRAVENOUS

## 2020-07-08 MED ORDER — VEDOLIZUMAB IVPB
300 mg | Freq: Once | INTRAVENOUS | 0 refills
Start: 2020-07-08 — End: ?

## 2020-07-09 ENCOUNTER — Ambulatory Visit: Admit: 2020-07-08 | Discharge: 2020-07-09 | Payer: BC Managed Care – PPO

## 2020-07-09 DIAGNOSIS — K5100 Ulcerative (chronic) pancolitis without complications: Secondary | ICD-10-CM

## 2020-07-26 IMAGING — CR CHEST
1 series · 1 of 1 positions shown · non-contrast
Comparison: none

[chest ap grid]
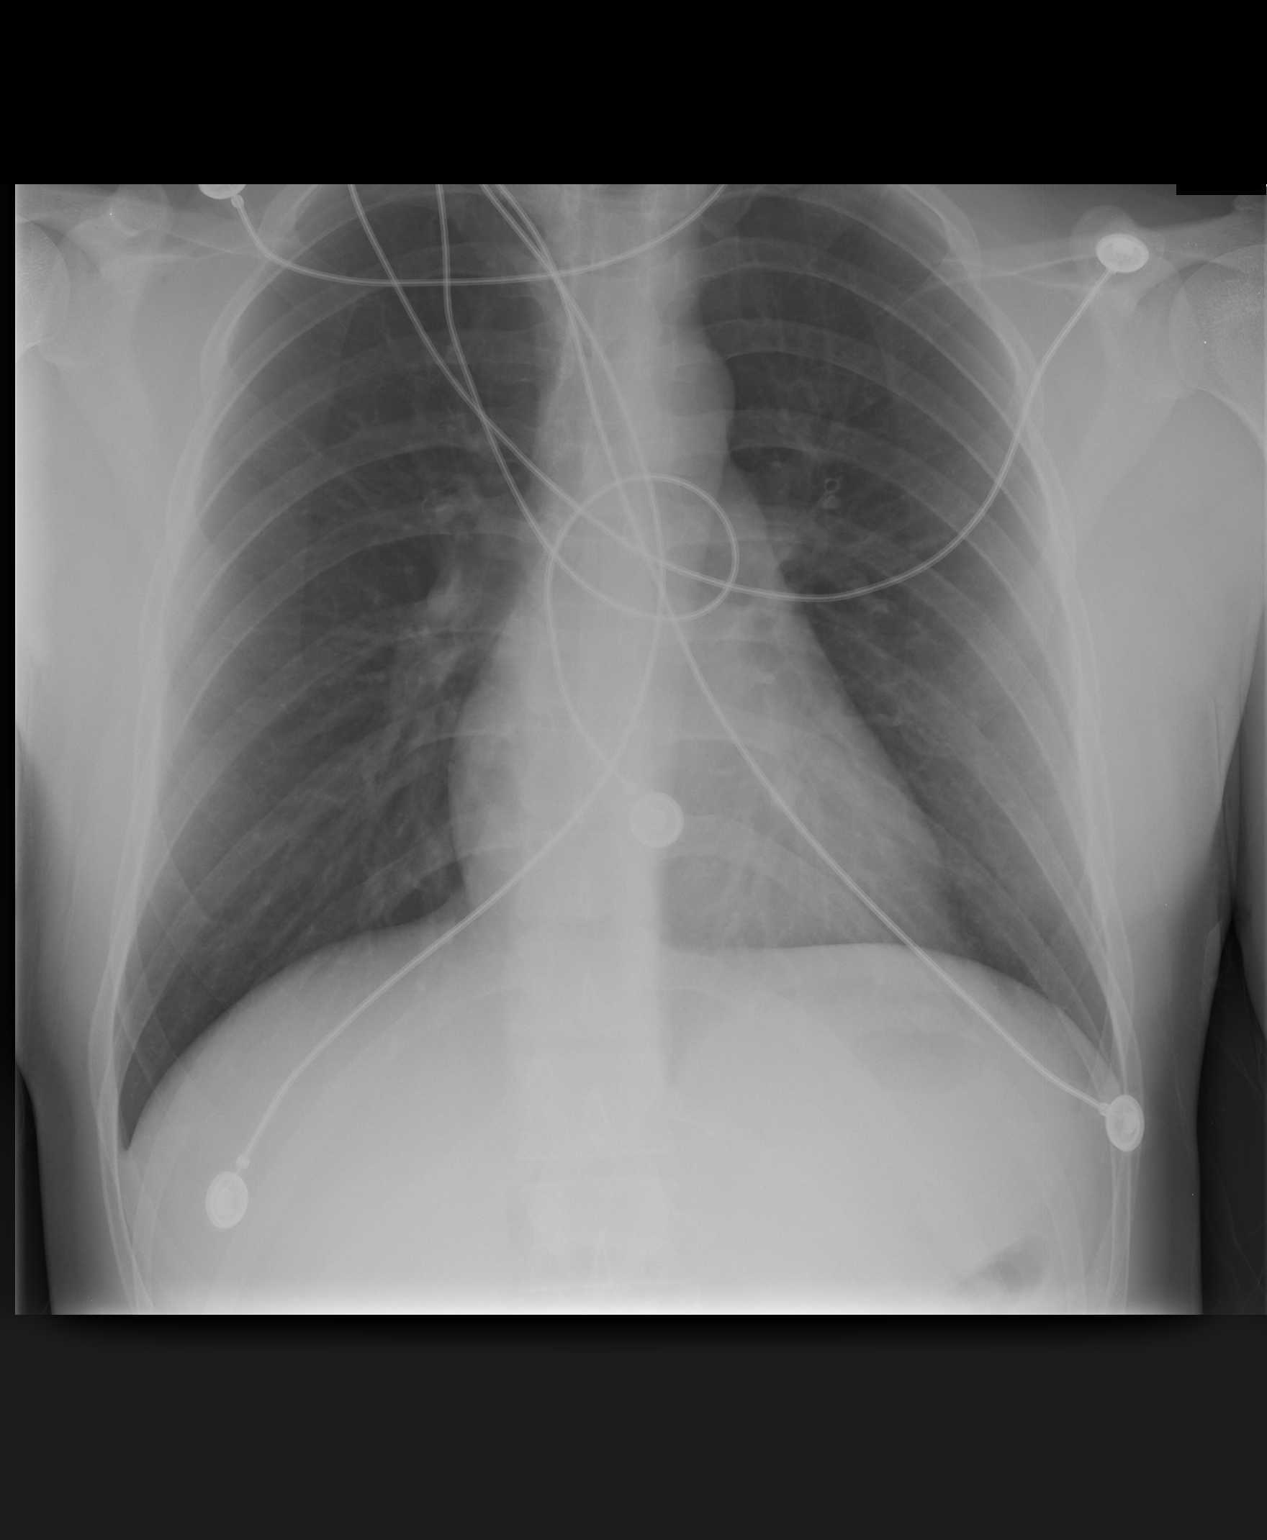

[1 of 1 positions shown; findings below may reference images not displayed]

DIAGNOSTIC STUDIES

EXAM
RADIOLOGICAL EXAMINATION, CHEST; SINGLE VIEW, FRONTAL CPT 16666

INDICATION
POTS
Pt c/o irregular heart rate and feeling like he is going to "pass out". Shielded. AW

TECHNIQUE
Single AP portable view of the chest was performed.

COMPARISONS
10/23/2017

FINDINGS
The cardiac and mediastinal contours are unremarkable.There isno evidence of acute consolidation,
congestion, or pleural effusions.][No acute displaced fracture.

IMPRESSION
No acute consolidation, congestion, or pleural effusions.

Tech Notes:

Pt c/o irregular heart rate and feeling like he is going to "pass out". Shielded. AW

## 2020-07-28 ENCOUNTER — Encounter: Admit: 2020-07-28 | Discharge: 2020-07-28 | Payer: BC Managed Care – PPO

## 2020-08-03 ENCOUNTER — Encounter: Admit: 2020-08-03 | Discharge: 2020-08-03 | Payer: BC Managed Care – PPO

## 2020-08-03 NOTE — Progress Notes
Benefits relayed to: Clinic/pt    Therapy(s) quoted: Entyvio 334m Q56D     Drug Copay: $1299.60  Supply Copay: $15.16  Nursing Copay: $24.36 and $6.50    Total estimated expense for 7 days of therapy: $1345.62 , after remaining deductible of $1500* is met   *patient's benefits run 07/08/20 - 07/08/21. Deductible is currently $1500 as of 08/03/20

## 2020-09-03 ENCOUNTER — Encounter: Admit: 2020-09-03 | Discharge: 2020-09-03 | Payer: BC Managed Care – PPO

## 2020-09-03 DIAGNOSIS — K5100 Ulcerative (chronic) pancolitis without complications: Secondary | ICD-10-CM

## 2020-09-03 LAB — CBC
HEMATOCRIT: 36 % — ABNORMAL LOW (ref 40–50)
HEMOGLOBIN: 12 g/dL — ABNORMAL LOW (ref 13.5–16.5)
MCH: 31 pg (ref 26–34)
MCHC: 33 g/dL (ref 32.0–36.0)
MCV: 92 FL (ref 80–100)
MPV: 9.6 FL (ref 7–11)
PLATELET COUNT: 264 K/UL (ref 150–400)
RBC COUNT: 3.9 M/UL — ABNORMAL LOW (ref 4.4–5.5)
RDW: 13 % (ref 11–15)
WBC COUNT: 6 K/UL (ref 4.5–11.0)

## 2020-09-03 LAB — C REACTIVE PROTEIN (CRP): C-REACTIVE PROTEIN: 0.4 mg/dL (ref <1.0)

## 2020-09-03 LAB — COMPREHENSIVE METABOLIC PANEL
ANION GAP: 8 (ref 3–12)
EGFR: >60 mL/min (ref >60)
GLUCOSE,PANEL: 70 mg/dL (ref 70–100)
POTASSIUM: 3.6 MMOL/L (ref 3.5–5.1)
SODIUM: 137 MMOL/L (ref 137–147)

## 2020-09-04 ENCOUNTER — Encounter: Admit: 2020-09-04 | Discharge: 2020-09-04 | Payer: BC Managed Care – PPO

## 2020-09-15 ENCOUNTER — Encounter: Admit: 2020-09-15 | Discharge: 2020-09-15 | Payer: BC Managed Care – PPO

## 2020-09-15 DIAGNOSIS — K5190 Ulcerative colitis, unspecified, without complications: Secondary | ICD-10-CM

## 2020-09-15 MED ORDER — LACTATED RINGERS IV SOLP
INTRAVENOUS | 0 refills
Start: 2020-09-15 — End: ?

## 2020-09-15 MED ORDER — PEG 3350-ELECTROLYTES 236-22.74-6.74 -5.86 GRAM PO SOLR
0 refills | 1.00000 days | Status: AC
Start: 2020-09-15 — End: ?

## 2020-09-15 NOTE — Progress Notes
Placed PFC for colonoscopy to be done in December 2022. Bowel prep sent to preferred pharmacy and prep instructions sent via MyChart.

## 2020-09-22 ENCOUNTER — Ambulatory Visit: Admit: 2020-09-22 | Discharge: 2020-09-22 | Payer: BC Managed Care – PPO

## 2020-09-22 DIAGNOSIS — K5190 Ulcerative colitis, unspecified, without complications: Secondary | ICD-10-CM

## 2020-09-26 ENCOUNTER — Encounter: Admit: 2020-09-26 | Discharge: 2020-09-26 | Payer: BC Managed Care – PPO

## 2020-11-17 ENCOUNTER — Encounter: Admit: 2020-11-17 | Discharge: 2020-11-17 | Payer: BC Managed Care – PPO

## 2020-11-17 ENCOUNTER — Ambulatory Visit: Admit: 2020-11-17 | Discharge: 2020-11-18 | Payer: BC Managed Care – PPO

## 2020-11-17 DIAGNOSIS — D849 Immunodeficiency, unspecified: Secondary | ICD-10-CM

## 2020-11-17 DIAGNOSIS — F419 Anxiety disorder, unspecified: Secondary | ICD-10-CM

## 2020-11-17 DIAGNOSIS — I499 Cardiac arrhythmia, unspecified: Secondary | ICD-10-CM

## 2020-11-17 DIAGNOSIS — R1310 Dysphagia, unspecified: Secondary | ICD-10-CM

## 2020-11-17 DIAGNOSIS — A0472 Enterocolitis due to Clostridium difficile, not specified as recurrent: Secondary | ICD-10-CM

## 2020-11-17 DIAGNOSIS — R195 Other fecal abnormalities: Secondary | ICD-10-CM

## 2020-11-17 DIAGNOSIS — G90A Postural orthostatic tachycardia syndrome: Secondary | ICD-10-CM

## 2020-11-17 DIAGNOSIS — K5190 Ulcerative colitis, unspecified, without complications: Secondary | ICD-10-CM

## 2020-11-17 DIAGNOSIS — K5100 Ulcerative (chronic) pancolitis without complications: Secondary | ICD-10-CM

## 2020-11-17 MED ORDER — DICYCLOMINE 10 MG PO CAP
10 mg | ORAL_CAPSULE | Freq: Three times a day (TID) | ORAL | 1 refills | Status: AC | PRN
Start: 2020-11-17 — End: ?

## 2020-11-17 MED ORDER — ONDANSETRON HCL 4 MG PO TAB
4 mg | ORAL_TABLET | ORAL | 1 refills | 8.00000 days | Status: AC | PRN
Start: 2020-11-17 — End: ?

## 2020-12-02 ENCOUNTER — Encounter: Admit: 2020-12-02 | Discharge: 2020-12-02 | Payer: Commercial Managed Care - Pharmacy Benefit Manager

## 2020-12-13 ENCOUNTER — Encounter: Admit: 2020-12-13 | Discharge: 2020-12-13 | Payer: Commercial Managed Care - HMO

## 2020-12-15 ENCOUNTER — Encounter: Admit: 2020-12-15 | Discharge: 2020-12-15 | Payer: Commercial Managed Care - HMO

## 2020-12-15 NOTE — Telephone Encounter
Letter from [place], dated 12/01/20 received today.  Approval for [facility] J3380 Vedolizumab 300mg  injection.Routing to be scanned into pts chart.

## 2020-12-22 ENCOUNTER — Encounter: Admit: 2020-12-22 | Discharge: 2020-12-22 | Payer: Commercial Managed Care - HMO

## 2020-12-22 DIAGNOSIS — K5190 Ulcerative colitis, unspecified, without complications: Secondary | ICD-10-CM

## 2020-12-22 DIAGNOSIS — I499 Cardiac arrhythmia, unspecified: Secondary | ICD-10-CM

## 2020-12-22 DIAGNOSIS — F419 Anxiety disorder, unspecified: Secondary | ICD-10-CM

## 2021-02-01 ENCOUNTER — Encounter: Admit: 2021-02-01 | Discharge: 2021-02-01 | Payer: Commercial Managed Care - HMO

## 2021-02-01 DIAGNOSIS — K5190 Ulcerative colitis, unspecified, without complications: Secondary | ICD-10-CM

## 2021-02-01 DIAGNOSIS — F419 Anxiety disorder, unspecified: Secondary | ICD-10-CM

## 2021-02-01 DIAGNOSIS — I499 Cardiac arrhythmia, unspecified: Secondary | ICD-10-CM

## 2021-02-01 DIAGNOSIS — R1310 Dysphagia, unspecified: Secondary | ICD-10-CM

## 2021-02-03 ENCOUNTER — Encounter: Admit: 2021-02-03 | Discharge: 2021-02-03 | Payer: Commercial Managed Care - HMO

## 2021-02-03 DIAGNOSIS — K5190 Ulcerative colitis, unspecified, without complications: Secondary | ICD-10-CM

## 2021-02-03 DIAGNOSIS — R1310 Dysphagia, unspecified: Secondary | ICD-10-CM

## 2021-02-03 DIAGNOSIS — F419 Anxiety disorder, unspecified: Secondary | ICD-10-CM

## 2021-02-03 DIAGNOSIS — I499 Cardiac arrhythmia, unspecified: Secondary | ICD-10-CM

## 2021-02-07 ENCOUNTER — Encounter: Admit: 2021-02-07 | Discharge: 2021-02-07 | Payer: Commercial Managed Care - HMO

## 2021-02-07 ENCOUNTER — Ambulatory Visit: Admit: 2021-02-07 | Discharge: 2021-02-07 | Payer: Commercial Managed Care - HMO

## 2021-02-07 ENCOUNTER — Encounter: Admit: 2021-02-07 | Discharge: 2021-02-07 | Payer: BC Managed Care – PPO

## 2021-02-07 DIAGNOSIS — K5190 Ulcerative colitis, unspecified, without complications: Secondary | ICD-10-CM

## 2021-02-07 DIAGNOSIS — F419 Anxiety disorder, unspecified: Secondary | ICD-10-CM

## 2021-02-07 DIAGNOSIS — I499 Cardiac arrhythmia, unspecified: Secondary | ICD-10-CM

## 2021-02-07 DIAGNOSIS — R1310 Dysphagia, unspecified: Secondary | ICD-10-CM

## 2021-02-07 MED ORDER — PROPOFOL 10 MG/ML IV EMUL 20 ML (INFUSION)(AM)(OR)
INTRAVENOUS | 0 refills | Status: DC
Start: 2021-02-07 — End: 2021-02-07

## 2021-02-07 MED ORDER — LIDOCAINE (PF) 20 MG/ML (2 %) IJ SOLN
INTRAVENOUS | 0 refills | Status: DC
Start: 2021-02-07 — End: 2021-02-07

## 2021-02-07 MED ORDER — DEXMEDETOMIDINE IN 0.9 % NACL 80 MCG/20 ML (4 MCG/ML) IV SOLN
INTRAVENOUS | 0 refills | Status: DC
Start: 2021-02-07 — End: 2021-02-07

## 2021-02-07 MED ORDER — PROPOFOL INJ 10 MG/ML IV VIAL
INTRAVENOUS | 0 refills | Status: DC
Start: 2021-02-07 — End: 2021-02-07

## 2021-02-08 ENCOUNTER — Encounter: Admit: 2021-02-08 | Discharge: 2021-02-08 | Payer: Commercial Managed Care - HMO

## 2021-02-08 DIAGNOSIS — R1310 Dysphagia, unspecified: Secondary | ICD-10-CM

## 2021-02-08 DIAGNOSIS — K5190 Ulcerative colitis, unspecified, without complications: Secondary | ICD-10-CM

## 2021-02-08 DIAGNOSIS — F419 Anxiety disorder, unspecified: Secondary | ICD-10-CM

## 2021-02-08 DIAGNOSIS — I499 Cardiac arrhythmia, unspecified: Secondary | ICD-10-CM

## 2021-02-09 ENCOUNTER — Encounter: Admit: 2021-02-09 | Discharge: 2021-02-09 | Payer: Commercial Managed Care - HMO

## 2021-02-22 ENCOUNTER — Encounter: Admit: 2021-02-22 | Discharge: 2021-02-22 | Payer: Commercial Managed Care - HMO

## 2021-03-22 ENCOUNTER — Encounter: Admit: 2021-03-22 | Discharge: 2021-03-22 | Payer: Commercial Managed Care - HMO

## 2021-03-22 NOTE — Telephone Encounter
EMAIL FROM RN REQUESTING ENTYVIO ORDERS TO BE SIGNED BY PROVIDER.  PROVIDER SIGNED AND FAXED TO [facility].  FAX SUCCESSFUL.  ROUTING ORDERS TO BE SCANNED INTO PTS CHART.

## 2021-04-03 ENCOUNTER — Encounter: Admit: 2021-04-03 | Discharge: 2021-04-03 | Payer: Commercial Managed Care - HMO

## 2021-04-03 MED ORDER — VEDOLIZUMAB IVPB
300 mg | Freq: Once | INTRAVENOUS | 0 refills
Start: 2021-04-03 — End: ?

## 2021-04-04 ENCOUNTER — Encounter: Admit: 2021-04-04 | Discharge: 2021-04-04 | Payer: Commercial Managed Care - HMO

## 2021-04-10 ENCOUNTER — Encounter: Admit: 2021-04-10 | Discharge: 2021-04-10 | Payer: Commercial Managed Care - HMO

## 2021-04-10 ENCOUNTER — Ambulatory Visit: Admit: 2021-04-10 | Discharge: 2021-04-11 | Payer: Commercial Managed Care - HMO

## 2021-04-10 VITALS — BP 111/59 | Temp 97.90000°F

## 2021-04-10 VITALS — BP 128/65 | HR 74 | Temp 97.60000°F | Resp 18 | Ht 64.016 in | Wt 179.4 lb

## 2021-04-10 DIAGNOSIS — K5100 Ulcerative (chronic) pancolitis without complications: Principal | ICD-10-CM

## 2021-04-10 MED ORDER — VEDOLIZUMAB IVPB
300 mg | Freq: Once | INTRAVENOUS | 0 refills
Start: 2021-04-10 — End: ?

## 2021-04-10 MED ORDER — VEDOLIZUMAB IVPB
300 mg | Freq: Once | INTRAVENOUS | 0 refills | Status: CP
Start: 2021-04-10 — End: ?
  Administered 2021-04-10 (×2): 300 mg via INTRAVENOUS

## 2021-04-17 ENCOUNTER — Encounter: Admit: 2021-04-17 | Discharge: 2021-04-17 | Payer: Commercial Managed Care - HMO

## 2021-04-17 DIAGNOSIS — K5100 Ulcerative (chronic) pancolitis without complications: Secondary | ICD-10-CM

## 2021-06-06 ENCOUNTER — Ambulatory Visit: Admit: 2021-06-06 | Discharge: 2021-06-07 | Payer: Commercial Managed Care - HMO

## 2021-06-06 ENCOUNTER — Encounter: Admit: 2021-06-06 | Discharge: 2021-06-06 | Payer: Commercial Managed Care - HMO

## 2021-06-06 VITALS — BP 108/58 | HR 79 | Temp 97.70000°F | Resp 18 | Ht 64.016 in | Wt 176.6 lb

## 2021-06-06 VITALS — BP 103/47 | HR 68 | Temp 98.20000°F

## 2021-06-06 DIAGNOSIS — K5100 Ulcerative (chronic) pancolitis without complications: Principal | ICD-10-CM

## 2021-06-06 DIAGNOSIS — R1310 Dysphagia, unspecified: Secondary | ICD-10-CM

## 2021-06-06 DIAGNOSIS — I499 Cardiac arrhythmia, unspecified: Secondary | ICD-10-CM

## 2021-06-06 DIAGNOSIS — K5190 Ulcerative colitis, unspecified, without complications: Secondary | ICD-10-CM

## 2021-06-06 DIAGNOSIS — F419 Anxiety disorder, unspecified: Secondary | ICD-10-CM

## 2021-06-06 MED ORDER — VEDOLIZUMAB IVPB
300 mg | Freq: Once | INTRAVENOUS | 0 refills
Start: 2021-06-06 — End: ?

## 2021-06-06 MED ORDER — VEDOLIZUMAB IVPB
300 mg | Freq: Once | INTRAVENOUS | 0 refills | Status: CP
Start: 2021-06-06 — End: ?
  Administered 2021-06-06 (×2): 300 mg via INTRAVENOUS

## 2021-06-08 ENCOUNTER — Encounter: Admit: 2021-06-08 | Discharge: 2021-06-08 | Payer: Commercial Managed Care - HMO

## 2021-06-08 NOTE — Progress Notes
Pharmacy Medication Re-assessment    Indication/Regimen  The regimen of ENTYVIO IV indefinitely is appropriate for Jesse Peters who has Ulcerative colitis (HCC).    Renal dose adjustments are not required. Hepatic dose adjustments are not required. Dose titration is not required.    The medication(s) will be administered at an infusion center.    Therapeutic Goals and Monitoring  The goal of therapy is to control symptoms and achieve remission.    Disease activity (patient reported): remission  Patient reported improvement: 75-100 percent improvement in symptoms since starting therapy  Clinical symptoms: clinical remission  Bowel movements per day: 1-3  Steroid course in past 90 days: no  Hospitalization visit for IBD (past 90 days): 0  ED visit for IBD (past 90 days): 0  Flare (past 90 days): no  Patient is currently in clinical remission. pt denies s/s of flare    Endoscopic Evaluation  Endoscopic procedure completed: yes  Evaluation: improved  Remission: achieved  02/07/21: The entire examined colon and ileum is normal    Histologic Evaluation  Test completed: yes  Evaluation: improved  Remission: achieved  02/07/21: No diagnositic abnormalities    Evaluation  The patient is making progress toward achieving their therapeutic goal. The plan is to continue current therapy.    Past Medical History and Comorbidities  Patient Active Problem List   Diagnosis   ? Polyuria   ? Arrhythmia   ? Chest pain   ? Postural orthostatic tachycardia syndrome   ? SOB (shortness of breath)   ? Dizziness   ? Colitis due to Clostridioides difficile   ? Ulcerative pancolitis without complication (HCC)   ? Elevated fecal calprotectin   ? Medication management   ? Healthcare maintenance   ? Left upper quadrant abdominal pain     Additional comorbidities: no    Labs and Diagnostic Tests  No results found for: TSPOTTB, QUANTIFERTB  Anti HBs   Date/Time Value Ref Range Status   10/25/2017 12:20 PM <2.5 mIU/ml Final     Comment: Hepatitis B Surface Antibody Reference Ranges                        >12.0     Positive  8.0-12.0  Equivocal  <8.0      Negative        Lab Results   Component Value Date/Time    RBC 4.32 (L) 06/06/2021 03:44 PM    HGB 13.6 06/06/2021 03:44 PM    HCT 39.6 (L) 06/06/2021 03:44 PM    MCV 91.8 06/06/2021 03:44 PM    MCH 31.6 06/06/2021 03:44 PM    MCHC 34.4 06/06/2021 03:44 PM    RDW 13.0 06/06/2021 03:44 PM    PLTCT 291 06/06/2021 03:44 PM    MPV 9.5 06/06/2021 03:44 PM       Allergies  Allergies   Allergen Reactions   ? Strawberry RASH        Immunizations  Vaccine history was reviewed with the patient. Education was provided on the importance of completing vaccines. The patient should avoid live vaccines.    There is no immunization history for the selected administration types on file for this patient.    Medication Reconciliation  Medication history and reconciliation were performed (including prescription medications, supplements, over the counter, and herbal products). The medication list was updated and the patient's current medication list is included. The patient was instructed to speak with their health care provider before starting  any new drug, including prescription or over the counter, natural / herbal products, or vitamins.    Drug Interactions    Drug-Drug Interactions  Drug-drug interactions were evaluated. There were not clinically significant drug-drug interactions.     Drug-Food Interactions  Drug-food interactions were not evaluated (NA - not oral).    Home Medications    Medication Sig   atenoloL (TENORMIN) 25 mg tablet Take 1.5 tablets by mouth daily.  Patient taking differently: Take 1.5 tablets by mouth daily. 1 and a half tablet daily   busPIRone (BUSPAR) 15 mg tablet Take one tablet by mouth twice daily.   fludrocortisone (FLORINEF) 0.1 mg tablet Take one tablet by mouth daily.   hydrocortisone 2.5% (ANUSOL-HC) 2.5 % rectal cream To be used twice daily for hemorrhoids. midodrine (PROAMATINE) 2.5 mg tablet TAKE 1 TABLET BY MOUTH THREE TIMES DAILY AS NEEDED FOR LOW BLOOD PRESSURE. DO NOT LAY DOWN FOR 4 HOURS AFTER EACH DOSE   PARoxetine (PAXIL) 10 mg tablet Daily   vedolizumab (ENTYVIO) 300 mg injection Administer 5 mL through vein every 8 weeks. Indications: last dose approx 12/08/2020     Adverse Drug Reactions  Adverse drug reactions were reviewed with the patient.    Significant adverse drug reaction(s) were not identified.    Side effect(s) were not reported.    The patient does not have infection related concerns.    Adherence  Refill and adherence history were reviewed with the patient. The patient was educated on the importance of adherence.    Patient is adherent with refills: yes  Patient is meeting refill adherence goal: yes    Patient reported 0 missed doses over the past 30 days.  Significance of missed doses: NA - no missed doses   Patient is meeting reported adherence goal.    Safety Precautions    Risk Evaluation and Mitigation (REMS) Assessment: REMS is not required for this medication.    Safety precautions were addressed and discussed with the patient as applicable.    Contraindications: Jesse Peters does not have contraindications to this medication.      Pregnancy Status: Male, education not applicable.    Medication Education  Counseling was not completed because patient was previously educated and did not require additional counseling.    Jesse Peters was given the opportunity to ask questions but did not have any questions at the time. Patient was reminded of the refill process and encouraged to call with questions. The monitoring and follow-up plan was discussed with the patient. The patient was instructed to contact their health care provider if their symptoms or health problems do not get better or if they become worse. The patient should contact the specialty pharmacy at [phone] if they have any questions or concerns regarding their medication therapy. The patient verbalized acceptance and understanding.    Follow-up Plan  The patient will be reassessed within 1 year.    Patient will receive the medication(s) at [facility] home infusion rather than directly from a pharmacy.    Benedetto Coons, PHARMD

## 2021-08-17 ENCOUNTER — Encounter: Admit: 2021-08-17 | Discharge: 2021-08-17 | Payer: BC Managed Care – PPO

## 2021-08-31 ENCOUNTER — Encounter: Admit: 2021-08-31 | Discharge: 2021-08-31 | Payer: BC Managed Care – PPO

## 2021-10-19 ENCOUNTER — Encounter: Admit: 2021-10-19 | Discharge: 2021-10-19 | Payer: BC Managed Care – PPO

## 2021-10-19 NOTE — Telephone Encounter
Received fax from [facility] with date of infusion of 09/26/21 at 8am.

## 2021-11-13 ENCOUNTER — Encounter: Admit: 2021-11-13 | Discharge: 2021-11-13 | Payer: BC Managed Care – PPO

## 2021-11-22 ENCOUNTER — Encounter: Admit: 2021-11-22 | Discharge: 2021-11-22 | Payer: BC Managed Care – PPO

## 2022-01-24 ENCOUNTER — Encounter: Admit: 2022-01-24 | Discharge: 2022-01-24 | Payer: BC Managed Care – PPO

## 2022-03-08 ENCOUNTER — Encounter: Admit: 2022-03-08 | Discharge: 2022-03-08 | Payer: BC Managed Care – PPO

## 2022-03-08 NOTE — Telephone Encounter
03/08/2022  Records request faxed per workqueue task below.  [facility] [phone] and [facility] [phone]  sdc    Collect records from [facility] [place] Ph. [phone]  [place] records from [facility] Ph. [phone]

## 2022-03-20 ENCOUNTER — Encounter: Admit: 2022-03-20 | Discharge: 2022-03-20 | Payer: BC Managed Care – PPO

## 2022-03-20 NOTE — Telephone Encounter
Entyvio Infusion Nursing Notes received from [facility] - Patient tolerated infusion with problems. Labs also drawn. - Next infusion 05/15/22.

## 2022-05-04 ENCOUNTER — Encounter: Admit: 2022-05-04 | Discharge: 2022-05-04 | Payer: BC Managed Care – PPO

## 2022-05-04 ENCOUNTER — Ambulatory Visit: Admit: 2022-05-04 | Discharge: 2022-05-05 | Payer: BC Managed Care – PPO

## 2022-05-04 DIAGNOSIS — D849 Immunodeficiency, unspecified: Secondary | ICD-10-CM

## 2022-05-04 DIAGNOSIS — Z79899 Other long term (current) drug therapy: Secondary | ICD-10-CM

## 2022-05-04 DIAGNOSIS — M6289 Other specified disorders of muscle: Secondary | ICD-10-CM

## 2022-05-04 DIAGNOSIS — K5100 Ulcerative (chronic) pancolitis without complications: Secondary | ICD-10-CM

## 2022-05-04 NOTE — Progress Notes
Continuum of Care Case Management Note        GASTROENTEROLOGY     Plan:   care team introduction     Interventions:   Patient has a diagnosis of IBD and is an established patient in the GI clinic.  SW sent communication via mychart introducing self and explaining role. SW encouraged pt to reach out to SW for any social work needs.     Amoura Ransier, LSCSW

## 2022-05-09 ENCOUNTER — Encounter: Admit: 2022-05-09 | Discharge: 2022-05-09 | Payer: BC Managed Care – PPO

## 2022-05-29 ENCOUNTER — Encounter: Admit: 2022-05-29 | Discharge: 2022-05-29 | Payer: BC Managed Care – PPO

## 2022-05-29 NOTE — Progress Notes
Specialty Medication Reassessment Attempt    Medication name: ENTYVIO IV    Attempt to reach Rosine Beat to verify compliance and assess tolerance of their specialty medication. No answer. Left voicemail asking patient to return call to the pharmacist at [phone].    Benedetto Coons, PHARMD

## 2022-06-27 ENCOUNTER — Encounter: Admit: 2022-06-27 | Discharge: 2022-06-27 | Payer: BC Managed Care – PPO

## 2022-07-06 ENCOUNTER — Encounter: Admit: 2022-07-06 | Discharge: 2022-07-06 | Payer: BC Managed Care – PPO

## 2022-07-16 ENCOUNTER — Encounter: Admit: 2022-07-16 | Discharge: 2022-07-16 | Payer: BC Managed Care – PPO

## 2022-08-14 ENCOUNTER — Encounter: Admit: 2022-08-14 | Discharge: 2022-08-14 | Payer: BC Managed Care – PPO

## 2022-08-14 NOTE — Telephone Encounter
Received VM from [facility] asking for updated order and recent clinical notes for patient's Entyvio. Signed orders and clinic notes from April faxed to [facility] with successful fax confirmation received.

## 2022-09-07 ENCOUNTER — Encounter: Admit: 2022-09-07 | Discharge: 2022-09-07 | Payer: BC Managed Care – PPO

## 2022-09-07 NOTE — Telephone Encounter
Nurses notes received from: [facility] for DOS 09/07/22  Medication:Entyvio 300mg   Notes reviewed; no reactions or difficulty documented.   See notes scanned in.

## 2022-11-01 ENCOUNTER — Ambulatory Visit: Admit: 2022-11-01 | Discharge: 2022-11-02 | Payer: BC Managed Care – PPO

## 2022-11-01 ENCOUNTER — Encounter: Admit: 2022-11-01 | Discharge: 2022-11-01 | Payer: BC Managed Care – PPO

## 2022-11-01 DIAGNOSIS — Z0000 Encounter for general adult medical examination without abnormal findings: Secondary | ICD-10-CM

## 2022-11-01 DIAGNOSIS — K5902 Outlet dysfunction constipation: Secondary | ICD-10-CM

## 2022-11-01 DIAGNOSIS — R195 Other fecal abnormalities: Secondary | ICD-10-CM

## 2022-11-01 DIAGNOSIS — M6289 Other specified disorders of muscle: Secondary | ICD-10-CM

## 2022-11-01 DIAGNOSIS — R1310 Dysphagia, unspecified: Secondary | ICD-10-CM

## 2022-11-01 DIAGNOSIS — R109 Unspecified abdominal pain: Secondary | ICD-10-CM

## 2022-11-01 DIAGNOSIS — K5100 Ulcerative (chronic) pancolitis without complications: Secondary | ICD-10-CM

## 2022-11-01 DIAGNOSIS — Z79899 Other long term (current) drug therapy: Secondary | ICD-10-CM

## 2022-11-01 DIAGNOSIS — R1319 Other dysphagia: Secondary | ICD-10-CM

## 2022-11-01 DIAGNOSIS — I499 Cardiac arrhythmia, unspecified: Secondary | ICD-10-CM

## 2022-11-01 DIAGNOSIS — Z7185 Immunization counseling: Secondary | ICD-10-CM

## 2022-11-01 DIAGNOSIS — K5190 Ulcerative colitis, unspecified, without complications: Secondary | ICD-10-CM

## 2022-11-01 DIAGNOSIS — R1084 Generalized abdominal pain: Secondary | ICD-10-CM

## 2022-11-01 DIAGNOSIS — D849 Immunodeficiency, unspecified: Secondary | ICD-10-CM

## 2022-11-01 DIAGNOSIS — F419 Anxiety disorder, unspecified: Secondary | ICD-10-CM

## 2022-11-01 NOTE — Progress Notes
Obtained patient's verbal consent to treat them and their agreement to [facility] financial policy and NPP via this telehealth visit during the Coronavirus Public Health Emergency      IMPRESSION:  32 year old male with past medical history of recurrent C. 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    ibd hx:  October 2019: Developed GI symptoms of mucousy stools, abdominal cramps  December 12, 2017: Admitted to [facility] for bloody diarrhea, abdominal pain, tach treated symptomatically   January 2020: Started on prednisone after discharge and Flagyl by PCP  April 07, 2018: Tested positive for C. difficile.  Treated with course of p.o. vancomycin, continue to have unchanged diarrhea,  05/21/2018: Persistent C. difficile with repeat testing.  10-day course of fidaxomicin, mild improvement in symptoms.  06/24/2018: Recurrent positive C. difficile.   07/30/2018: FMT for recurrent C. difficile, showing normal ileum and colon but random colon biopsy showed chronic active colitis with cryptitis, crypt abscess negative for viral inclusions dysplasia.  Had weight loss of 6 pounds  September 01/2018: Repeat colonoscopy showing altered vascular pattern, and mildly mucosa in transverse colon, melena, fevers, chills, granular mucosa in the rectum, otherwise normal.  Small bowel looked normal, biopsies from TI were normal, biopsies from transverse colon and rectum showed active colitis with cryptitis and crypt abscess, biopsies the rest the colon were normal.  EGD showed normal esophagus dilated up to 18 mm, small hiatal hernia otherwise normal.  09/16/2018: Started on mesalamine 4.8 g daily, had some improvement in symptoms,  12/2018: normal HRM  Jun 03, 2019: Had repeat colonoscopy showing internal hemorrhoids, this was performed due to persistent symptoms.  Normal TI, normal colon, also biopsy showed no evidence of active disease.  Switched from Lialda to Asacol HD 4.8 g daily due to insurance preference, not able to get medications for 4 to 5 days, he had worsening symptoms over the summer.  Stool studies positive for leukocytes.  Hematochezia recurred.  Restarted Uceris, denied by insurance.  Prednisone 20 mg a slow taper started    02/16/2020: Briefly increase prednisone up to 20 mg daily for 10 days and tapered off completely  03/2020: fecal cal 299   April 2022: entyvio started   06/10/2020: Normal CRP  January 2023: EGD and colonoscopy performed, EGD normal.  Colonoscopy normal except for external hemorrhoids.  Repeated colonoscopy in 3 years recommended.  Random biopsies without diagnostic abnormality.    Discussion: Jesse Peters returns today in near clinical remission, today I discussed patient should complete fecal Cal Pro.  If there is elevation of fecal Cal, would be concerned of IBD flare.  We would likely increase Entyvio versus changing to another agent.  However if normal we will continue to treat underlying IBS symptoms.  I will refer patient to pelvic floor physical therapy and recommend low-dose MiraLAX to assist with constipation.  Otherwise patient will continue Entyvio every 8 weeks for now.  Patient may continue Bentyl twice daily as needed.  Due to dysphagia we will arrange esophagram.  If negative we may consider repeat HRM.  He will not need colonoscopy until January 2026.  Immunizations as below are recommended.  Return to clinic in 3 months      RECOMMENDATIONS:         esophagram will be arranged  Continue Bentyl  20 mg twice daily as needed  Fecal cal is due  Labs will be requested from [facility]  Continue Entyvio every  8 weeks  Patient is getting ready to change employers, will have new insurance, patient should upload new coverage information when he is able  MiraLAX 1 teaspoon - 1 tablespoon once daily for constipation  Referred to pelvic floor physical therapy  Flu shot, pneumonia vaccines were recommended  Repeat colonoscopy in January 2026    Return to clinic with Jesse Stivers APRN in 3 months    Return to clinic with Dr. Horald Peters in  6-8 months               Reason for Visit:  Follow up, indeterminate colitis    HPI:  Jesse Peters presents today for televisit follow-up.  He returns today stating that he has been having fluctuations in symptoms.  He is starting to notice more generalized abdominal pain and cramping.  He is using Bentyl 20 mg for management of this pain.  He does report that it does partially improved symptoms.  He is typically having 1-2 bowel movements a week.  Mentions he does have concern of pelvic floor dysfunction and at times has to manually disimpact.  He is not taking OTC laxatives.  Typically his stool is soft, he denies firm stool, melena, rectal pain or hematochezia.  There are no concerns of active hemorrhoids at today's visit.  He denies nausea, vomiting or distention/bloating.  He does still report having dysphagia.  He otherwise denies increased heartburn.  Denies extraintestinal manifestations of IBD.  He has been receiving Entyvio every 8 weeks.  Of note, is getting ready to change employers and plans on having no insurance with [employer]        History of the disease:  As above    PMH:  Past Medical History:   Diagnosis Date    Anxiety     Arrhythmia     Dysphagia     UC (ulcerative colitis) (HCC)        PSH:  Surgical History:   Procedure Laterality Date    PREPARATION FECAL MICROBIOTA FOR INSTILLATION WITH ASSESSMENT DONOR SPECIMEN, administered via colonoscopy N/A 07/30/2018    Performed by Jesse Derrick, MD at [facility] ENDO    COLONOSCOPY VIA STOMA WITH BIOPSY  07/30/2018    Performed by Jesse Derrick, MD at [facility] ENDO    ESOPHAGOGASTRODUODENOSCOPY WITH BIOPSY - FLEXIBLE N/A 09/09/2018    Performed by Jesse Jester, MD at [facility] ENDO    COLONOSCOPY WITH BIOPSY - FLEXIBLE  09/09/2018    Performed by Jesse Jester, MD at [facility] ENDO    HEART CATHETERIZATION  10/2018    [facility]    ESOPHAGEAL MOTILITY STUDY N/A 01/08/2019    Performed by Meyer Cory, MD at [facility] ENDO    Colonoscopy N/A 06/03/2019    Performed by Buckles, Vinnie Level, MD at [facility] OR    Colonoscopy N/A 02/07/2021    Performed by Jesse Derrick, MD at [facility] OR    ESOPHAGOGASTRODUODENOSCOPY WITH BIOPSY - FLEXIBLE N/A 02/07/2021    Performed by Jesse Derrick, MD at [facility] OR       Current Medications:    Current Outpatient Medications:     atenoloL (TENORMIN) 25 mg tablet, Take 1.5 tablets by mouth daily. (Patient taking differently: Take 1.5 tablets by mouth daily. 1 and a half tablet daily), Disp: 135 tablet, Rfl: 3    dicyclomine (BENTYL) 20 mg tablet, TAKE 1 TABLET BY MOUTH 4 TIMES DAILY AS NEEDED FOR ABDOMINAL CRAMPING, Disp: , Rfl:     hydrocortisone 2.5% (ANUSOL-HC)  2.5 % rectal cream, To be used twice daily for hemorrhoids., Disp: 28 g, Rfl: 0    PARoxetine (PAXIL) 10 mg tablet, Daily, Disp: , Rfl:     vedolizumab (ENTYVIO) 300 mg injection, Administer 5 mL through vein every 8 weeks. Indications: last dose approx 12/08/2020, Disp: , Rfl:     Allergies:  Allergies   Allergen Reactions    Strawberry RASH       SHx:  Social History     Tobacco Use    Smoking status: Never    Smokeless tobacco: Former     Types: Chew     Quit date: 01/09/2008   Vaping Use    Vaping status: Never Used   Substance Use Topics    Alcohol use: Yes     Alcohol/week: 10.0 standard drinks of alcohol     Types: 10 Shots of liquor per week     Comment: moderate    Drug use: Not Currently     Frequency: 2.0 times per week     Types: Marijuana       FHx:  Family History   Problem Relation Name Age of Onset    Arrhythmia Father      Heart Attack Maternal Grandmother          64s    Crohn's Disease Maternal Grandmother      Heart Disease Maternal Grandmother      Heart Attack Paternal Grandfather  49    Autoimmune Disease Mother          Myasthenia gravis    Blindness Neg Hx      Glaucoma Neg Hx      Macular Degen Neg Hx         Depression Screening:  Patient Scores:  PHQ-2: No data recorded    PHQ-9: No data recorded  Interventions:  PHQ-2: No data recorded    Depression Interventions PHQ-2/9: No data recorded    ROS:  Review of Systems  Constitutional: Negative.    HENT: Positive for trouble swallowing.    Eyes: Negative.    Respiratory: Negative.    Cardiovascular: Negative.    Gastrointestinal: Positive for abdominal pain and nausea.   Endocrine: Negative.    Genitourinary: Negative.    Musculoskeletal: Negative.    Skin: Negative.    Allergic/Immunologic: Negative.    Neurological: Negative.    Hematological: Negative.    Psychiatric/Behavioral: Negative.         PE:  There were no vitals filed for this visit.     83.9 kg (185 lb)  Body mass index is 30.79 kg/m?Marland Kitchen    Physical Exam   This is a televisit   Constitutional: Pt. is oriented to person, place, and time and in no distress.   HENT: Head: Normocephalic and atraumatic.   Eyes: Conjunctivae and EOM are normal.   Neck: Normal range of motion. Neck supple.   Pulmonary/Chest: Effort normal and no respiratory distress.   Musculoskeletal: Normal range of motion. There is no edema, tenderness or deformity.   Neurological: Pt. is alert and oriented to person, place, and time. Gait normal.   Skin: Skin is warm and dry. No rash noted. No erythema.   Psychiatric: Affect and judgment normal.      05/2019  Colonoscopy  Findings:        The terminal ileum appeared normal.        Non-bleeding internal hemorrhoids were found during retroflexion. The        hemorrhoids  were small.        The exam was otherwise without abnormality on direct and retroflexion        views.        Biopsies were taken with a cold forceps from the right and left colon        for histology.   Impression:                   - The examined portion of the ileum was normal.                                 - Non-bleeding internal hemorrhoids.                                 - The examination was otherwise normal on                                 direct and retroflexion views. - Biopsies were taken with a cold forceps for                                 histology.                                 - No endoscopic findings to suggest active                                 colitis.     A. Colonic mucosa, right colon biopsy r/o colitis, biopsy:   No diagnostic abnormalities.     B. Colonic mucosa, left colon biopsy r/o colitis, biopsy:   No diagnostic abnormalities.     Vidyuth Belsito R Theadore Blunck, APRN-NP      11/01/22

## 2022-11-06 ENCOUNTER — Encounter: Admit: 2022-11-06 | Discharge: 2022-11-06 | Payer: BC Managed Care – PPO

## 2022-11-15 ENCOUNTER — Encounter: Admit: 2022-11-15 | Discharge: 2022-11-15 | Payer: BC Managed Care – PPO

## 2022-11-15 NOTE — Telephone Encounter
Nurses notes received from: [facility]  Medication:Entyvio  Infusion Date: 11/02/2022  Notes reviewed; no reactions or difficulty documented.   See notes scanned in.

## 2023-01-23 ENCOUNTER — Encounter: Admit: 2023-01-23 | Discharge: 2023-01-23 | Payer: BC Managed Care – PPO

## 2023-02-15 ENCOUNTER — Encounter: Admit: 2023-02-15 | Discharge: 2023-02-15 | Payer: BC Managed Care – PPO

## 2023-03-21 ENCOUNTER — Encounter: Admit: 2023-03-21 | Discharge: 2023-03-21 | Payer: BC Managed Care – PPO

## 2023-04-11 ENCOUNTER — Encounter: Admit: 2023-04-11 | Discharge: 2023-04-11

## 2023-04-11 NOTE — Telephone Encounter
 TC from [place] at [facility] stating after multiple attempts, they have been unable to get in touch with patient to get him scheduled for next Entyvio infusion (q8w).    Last infusion was 03/15/23.  His infusion was previously delayed, had infusion 10/2022 and then was not infused again until 03/2023.  Auth and order expire 08/2023.     My Chart message sent to patient requesting he call and schedule infusion for 05/2023 and schedule f/u appointment (due 01/2023).

## 2023-04-23 ENCOUNTER — Encounter: Admit: 2023-04-23 | Discharge: 2023-04-23 | Payer: BLUE CROSS/BLUE SHIELD

## 2023-05-06 ENCOUNTER — Encounter: Admit: 2023-05-06 | Discharge: 2023-05-06 | Payer: BLUE CROSS/BLUE SHIELD

## 2023-05-06 NOTE — Telephone Encounter
 05-06-2023 Per Task Message, request faxed to [facility], [place],  (F) [phone], clp    Pcp, Dr. Kimberlee Peng, [facility], [phone]

## 2023-05-17 ENCOUNTER — Encounter: Admit: 2023-05-17 | Discharge: 2023-05-17 | Payer: BLUE CROSS/BLUE SHIELD

## 2023-06-05 ENCOUNTER — Encounter: Admit: 2023-06-05 | Discharge: 2023-06-05 | Payer: BLUE CROSS/BLUE SHIELD

## 2023-06-06 ENCOUNTER — Encounter: Admit: 2023-06-06 | Discharge: 2023-06-06 | Payer: BLUE CROSS/BLUE SHIELD

## 2023-06-13 ENCOUNTER — Encounter: Admit: 2023-06-13 | Discharge: 2023-06-13 | Payer: PRIVATE HEALTH INSURANCE

## 2023-06-13 DIAGNOSIS — R1084 Generalized abdominal pain: Secondary | ICD-10-CM

## 2023-06-13 DIAGNOSIS — Z136 Encounter for screening for cardiovascular disorders: Secondary | ICD-10-CM

## 2023-06-13 DIAGNOSIS — G90A Postural orthostatic tachycardia syndrome: Secondary | ICD-10-CM

## 2023-06-13 DIAGNOSIS — R0602 Shortness of breath: Secondary | ICD-10-CM

## 2023-06-13 DIAGNOSIS — E785 Hyperlipidemia, unspecified: Secondary | ICD-10-CM

## 2023-06-13 NOTE — Patient Instructions
 Labs fasting lipid profile  Follow up in 1 year    Follow up as directed.  Call sooner if issues.  Call the [facility] nursing line at [phone].  Leave a detailed message for the nurse in [place] with how we can assist you and we will call you back.

## 2023-06-13 NOTE — Progress Notes
 Date of Service: 06/13/2023    Jesse Peters is a 33 y.o. male.       HPI     Mr. Falero has been followed for chronic chest discomfort and palpitations.  His chronic chest discomfort is located in his left pectoral area and occurs several times a month.  It always occurs at rest and there is always associated tenderness which triggers the discomfort.  It is very suggestive for musculoskeletal discomfort and may be associated with weightlifting.  There is no respiratory variation associated with the discomfort and the discomfort is not suggestive for acid reflux.  He has chronic palpitations which are well-controlled with atenolol.  He has mild orthostasis which has been nonprogressive and not very bothersome.  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  Mr. Hewitt Lou reports that his ulcerative colitis has been stable on Entyvio.  Otherwise,the patient has been doing well and reports no angina, congestive symptoms, sensation of sustained forceful heart pounding, falls, presyncope or syncope.  His exercise tolerance has been stable. The patient reports no myalgias, claudication, bleeding abnormalities, or strokelike symptoms.       Vitals:    06/13/23 1346 06/13/23 1347 06/13/23 1348 06/13/23 1351   BP: 120/66 120/76 117/71 115/80   BP Source: Arm, Right Upper Arm, Right Upper Arm, Right Upper Arm, Right Upper   Pulse: 67 65 73 75   SpO2:       O2 Device:       PainSc:       Weight:       Height:         Body mass index is 30.89 kg/m?Aaron Aas     Past Medical History  Patient Active Problem List    Diagnosis Date Noted    Anxiety 06/12/2023    Asthma 06/12/2023    Abdominal cramping 11/01/2022    Pelvic floor dysfunction 11/01/2022    Constipation due to outlet dysfunction 11/01/2022    High risk medication use 11/01/2022    Other dysphagia 11/01/2022    Immunization counseling 11/01/2022    Medication management 11/21/2020    Healthcare maintenance 11/21/2020    Generalized abdominal pain 11/21/2020    Elevated fecal calprotectin 11/17/2020    Ulcerative pancolitis without complication (CMS-HCC) 12/24/2018    SOB (shortness of breath) 05/02/2018    Dizziness 05/02/2018    Colitis due to Clostridioides difficile 05/02/2018    Postural orthostatic tachycardia syndrome 10/29/2017     11/05/2018 - Autonomic Reflex Screen at [facility] - Normal study. There is no evidence of autonomic failure and no objective evidence of orthostatic intolerance.       Chest pain 10/25/2017     08/09/22 - CT Calcium Score at [facility] - Total Calcium Score of 0.  11/05/2018 - Right Heart Cath at [facility] - Left and right heart filling pressures and pulmonary artery pressures were low to normal at rest and throughout all stages of exercise.Cardiac output (CO) at rest was mildly elevated, but cardiac output reserve with exercise was severely depressed. The mean slope of increase in CO with respect to oxygen consumption (VO2) was 3.3 ml blood/ml O2 (normal is 6).  The increase in cardiac output with respect to VO2 was 62% predicted.  Peak heart rate was normal (192 bpm), indicating that the cardiac output reserve deficit was due to reduced stroke volume.  While echocardiography was not performed, given the patient's known normal cardiac function,  it is likely that the reduction in SV reserve was due to an inability to augment ventricular preload.  This is likely related to inadequate regulation of venous return, possibly related to autonomic dysfunction or POTS.There was not a significant peripheral impairment to exercise.  Peak exercise AVO2 difference was 12.68 ml/dl, which is 46% of hemoglobin. Femoral vein blood sampling was performed and he was able to reduce FV pO2 to 20 mmHg.  These findings suggest that there is not a major peripheral limitation to exercise.In summary, there is a cardiac output limitation to exercise related to stroke volume limitation and failure to augment ventricular preload.   11/03/2018 - Stress Echocardiogram at [facility] - Exercise echocardiogram negative for myocardial ischemia.  The patient's exercise capacity was limited (estimated 8.6 METS; 61 % FAC).  O2 uptake data detailed in the Electronic Medical Record (Cardiopulmonary (VO2) Exercise Test).  Ejection fraction response from 65 % at rest to 75 % - 80 % post-stress.  Left ventricular end-systolic volume decreased with stress.  The stress ECG was negative for ischemia.  Findings consistent with normal left ventricular filling pressure at rest.  Fused mitral inflow post-stress; but overall would suggest normal LV filling pressures post-exercise.   11/03/2018 - Cardiopulmonary Stress Test at [facility] - Exercise echocardiogram negative for myocardial ischemia. The patient's exercise capacity was limited (estimated 8.6 METS; 61 % FAC). O2 uptake data detailed in the Electronic Medical Record (Cardiopulmonary (VO2) Exercise Test).  Ejection fraction response from 65 % at rest to 75 % - 80 % post-stress. Left ventricular end-systolic volume decreased with stress. The stress ECG was negative for ischemia. Findings consistent with normal left ventricular filling pressure at rest. Fused mitral inflow post-stress; but overall would suggest normal LV filling pressures post-exercise.   10/26/2017 - Cardiac MRI at [facility] - Normal LV systolic function with a calculated ejection fraction of 70%. The LV cavity is at the upper end of normal per the end-diastolic volume index.  No definite areas of delayed hyperenhancement to suggest inflammatory or infiltrative process.  The resting perfusion pattern appeared normal. The takeoff of the coronary arteries appeared normal. RV size and function appeared normal. There are no significant valvular abnormalities. The aortic root was normal in size. The visualized portions of the pulmonary arteries were normal. There is no pericardial effusion.   10/25/2017 - CCTA at [facility] - Normal coronary arterial anatomy without significant plaque, stenosis, or coronary artery anomaly. Normal caliber thoracic aorta.         Polyuria 10/24/2017    Arrhythmia 10/24/2017     10/27/2018 - Holter Monitor at [facility] - The basic rhythm was sinus with sinus arrhythmia. The heart rate varied from 54 to 112 BPM. The average heart rate was 67 BPM. No VPCs were seen. Two SVPCs were seen singly. ST depression did not exceed preset criteria. The patient chest tightness twice, light-headedness, fatigue twice, heart fast/tight, and exhausted in the diary. At and around those times the rhythm was sinus and the heart rate varied from 56-111 BPM. No ectopy was noted. The basic rhythm was sinus with sinus arrhythmia. The heart rate varied from 54 to 112 BPM. The average heart rate was 67 BPM No VPCs were seen. Two SVPCs were seen singly. ST depression did not exceed preset criteria. The patient chest tightness twice, light-headedness, fatigue twice, heart fast/tight, and exhausted in the diary. At and around those times the rhythm was sinus and the heart rate varied from 56-111 BPM. No ectopy  was noted.           Review of Systems   Constitutional: Positive for malaise/fatigue.   HENT: Negative.     Eyes: Negative.    Cardiovascular:  Positive for chest pain, irregular heartbeat and palpitations.   Respiratory: Negative.     Endocrine: Negative.    Hematologic/Lymphatic: Negative.    Skin: Negative.    Musculoskeletal: Negative.    Gastrointestinal: Negative.    Genitourinary: Negative.    Neurological:  Positive for dizziness and light-headedness.   Psychiatric/Behavioral:  The patient is nervous/anxious.    Allergic/Immunologic: Negative.        Physical Exam  GENERAL: The patient is well developed, well nourished, resting comfortably and in no distress.   HEENT: No abnormalities of the visible oro-nasopharynx, conjunctiva or sclera are noted.  NECK: There is no jugular venous distension. Carotids are palpable and without bruits. There is no thyroid enlargement.  Chest: Lung fields are clear to auscultation. There are no wheezes or crackles.  CV: There is a regular rhythm. The first and second heart sounds are normal. There are no murmurs, gallops or rubs.  ABD: The abdomen is soft and supple with normal bowel sounds. There is no hepatosplenomegaly, ascites, tenderness, masses or bruits.  Neuro: There are no focal motor defects. Ambulation is normal. Cognitive function appears normal.  Ext: There is no edema or evidence of deep vein thrombosis. Peripheral pulses are satisfactory.    SKIN: There are no rashes and no cellulitis  PSYCH: The patient is calm, rationale and oriented.    Cardiovascular Studies  A twelve-lead ECG obtained on 06/13/2023 reveals normal sinus rhythm with a heart rate of 69 bpm.  There is no evidence of myocardial ischemia or infarction.    Cardiovascular Health Factors  Vitals BP Readings from Last 3 Encounters:   06/13/23 115/80   06/06/21 103/47   04/10/21 111/59     Wt Readings from Last 3 Encounters:   06/13/23 84.2 kg (185 lb 9.6 oz)   11/01/22 83.9 kg (185 lb)   06/06/21 80.1 kg (176 lb 9.6 oz)     BMI Readings from Last 3 Encounters:   06/13/23 30.89 kg/m?   11/01/22 30.79 kg/m?   06/06/21 30.30 kg/m?      Smoking Social History     Tobacco Use   Smoking Status Never   Smokeless Tobacco Former    Types: Chew    Quit date: 01/09/2008      Lipid Profile Cholesterol   Date Value Ref Range Status   10/24/2017 170 <200 MG/DL Final     HDL   Date Value Ref Range Status   10/24/2017 59 >40 MG/DL Final     LDL   Date Value Ref Range Status   10/24/2017 95 <100 MG/DL Final     Triglycerides   Date Value Ref Range Status   10/24/2017 63 <150 MG/DL Final      Blood Sugar Hemoglobin A1C   Date Value Ref Range Status   12/27/2017 5.1  Final     Glucose   Date Value Ref Range Status   05/06/2023 86  Final   06/06/2021 109 (H) 70 - 100 MG/DL Final   09/81/1914 49 (LL) 70 - 100 MG/DL Final Comment:     CRITICAL VALUE CALLED TO AND READ BACK BY/TIME/TECH  DR A DECINO at 04/10/2021 19:35:21 by [id number]       Glucose, POC   Date Value Ref Range Status   10/26/2017  94 70 - 100 MG/DL Final          Problems Addressed Today  Encounter Diagnoses   Name Primary?    Screening for heart disease Yes       Assessment and Plan   Mr. Athey reports that his palpitations are well-controlled with atenolol.  He reports chronic chest discomfort that is very suggestive for musculoskeletal discomfort.  It is always associated with left pectoral tenderness.  It occurs with rest and is never triggered by physical or emotional stress.  I have asked him to obtain a fasting lipid profile.  Cardiovascular risk factor modification was reviewed in detail.  Treatment for postural tachycardia and orthostasis was reviewed in detail.  I have asked him to return for follow-up in approximately 1 years time. The total time spent during this interview and exam with preparation and chart review was 60 minutes.         Current Medications (including today's revisions)   atenolol (TENORMIN) 25 mg tablet Take one tablet by mouth daily.    dicyclomine (BENTYL) 20 mg tablet Take one tablet by mouth twice daily.    hydrocortisone 2.5% (ANUSOL-HC) 2.5 % rectal cream To be used twice daily for hemorrhoids.    PARoxetine (PAXIL) 10 mg tablet Take one tablet by mouth daily.    vedolizumab (ENTYVIO) 300 mg injection Administer 5 mL through vein every 8 weeks. Indications: last dose approx 12/08/2020

## 2023-07-22 ENCOUNTER — Encounter: Admit: 2023-07-22 | Discharge: 2023-07-22 | Payer: PRIVATE HEALTH INSURANCE

## 2023-08-01 ENCOUNTER — Encounter: Admit: 2023-08-01 | Discharge: 2023-08-01 | Payer: PRIVATE HEALTH INSURANCE

## 2023-08-01 NOTE — Progress Notes
 Signed order for Entyvio  reload and maintenance faxed to [facility].

## 2023-09-03 ENCOUNTER — Encounter: Admit: 2023-09-03 | Discharge: 2023-09-03 | Payer: PRIVATE HEALTH INSURANCE

## 2023-09-03 ENCOUNTER — Ambulatory Visit: Admit: 2023-09-03 | Discharge: 2023-09-04 | Payer: PRIVATE HEALTH INSURANCE

## 2023-09-03 DIAGNOSIS — A498 Other bacterial infections of unspecified site: Secondary | ICD-10-CM

## 2023-09-03 DIAGNOSIS — Z0000 Encounter for general adult medical examination without abnormal findings: Secondary | ICD-10-CM

## 2023-09-03 DIAGNOSIS — R7989 Other specified abnormal findings of blood chemistry: Secondary | ICD-10-CM

## 2023-09-03 DIAGNOSIS — K5100 Ulcerative (chronic) pancolitis without complications: Principal | ICD-10-CM

## 2023-09-03 DIAGNOSIS — Z79899 Other long term (current) drug therapy: Secondary | ICD-10-CM

## 2023-09-03 DIAGNOSIS — R195 Other fecal abnormalities: Secondary | ICD-10-CM

## 2023-09-03 NOTE — Progress Notes
 Telehealth Visit Note    Date of Service: 09/03/2023    Subjective:      Obtained patient's verbal consent to treat them and their agreement to [facility] financial policy and NPP via this telehealth visit during the [facility]       Jesse Peters is a 33 y.o. male.    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 Tobie after his colonoscopy and pathology x2 were suggestive of inflammatory bowel disease.  Patient was initially seen in our clinic on 09/16/2018  then most recently seen by Adam Case, NP 10/28/2022.  Today I video called the patient for a telemedicine follow-up visit.    Patient states that he started having GI symptoms in October 2019, mainly with mucousy stools with some abdominal cramps.  He only saw blood about 3 times when he was admitted to [facility] on December/05/2017 with bloody diarrhea and abdominal pain, treated symptomatically then he was a started on prednisone, ciprofloxacin and Flagyl by his PCP in January 2020.  Patient states that prednisone did help a little bit specifically in his lethargy and fatigue symptoms but also help with his bloody diarrhea.  Patient/2020 tested positive for C. difficile on 04/07/2018.  He was initially treated with a course of PO vancomycin , however, he continued to have unchanged diarrhea and repeat testing on 05/21/18 showed persistent C diff.  He was then treated with a 10 day course of fidaxomicin .  This decreased the number of his stools from 5-6/day to 3 a day but still mushy with occasional blood.  Most recent positive C. difficile was on 06/24/2018 and patient eventually required F MT on 07/30/2018.  Colonoscopy performed for FMT showed normal ileum and colon. However random colon biopsies showed chronic active colitis with cryptitis, crypt abscesses negative for viral inclusions and dysplasia.  Patient continued to be symptomatic, having 3-5 bowel movements/day, occasionally bloody about once a week, associated with diffuse abdominal pain, stabbing, 7/10 in severity, without radiation, without precipitating or aggravating factors, with weight loss of 6 pounds but no melena, fevers, chills, or other GI symptoms.  Repeated colonoscopy 09/09/2018 showed altered vascular pattern with mild inflamed mucosa in the transverse colon, and granular mucosa in the rectum otherwise normal.  Small bowel also looked normal.  Biopsies from the TI were normal, biopsies from the transverse colon and rectum showed active colitis with cryptitis and crypt abscesses.  Biopsies from the rest of the colon were normal.  Patient also had EGD the same day 9/1 that showed normal esophagus dilated up to 18 mm, small hiatal hernia, otherwise normal.    After patient was seen in clinic 9/8 he was started on mesalamine  Lialda  4.8 g daily, with some improvement slightly continues to have mushy stools and occasional lower abdominal pain. Due to persistent symptoms patient had repeated colonoscopy 06/03/2019 that showed internal hemorrhoids but otherwise normal TI and normal colon.  Also biopsies showed no evidence of active disease.  Patient then was switched from Lialda  to Asacol  HD 4.8 g daily due to insurance preference and he was not able to get medications for 4-5 days.  He had worsening symptoms over the summer including frequent stools, mushy stools with significant blood with the stool with more than a  teaspoon of blood with bowel movements.  He had stool studies locally that were negative for infectious etiologies but positive for fecal leukocytes.  Plan was to restart Uceris that was declined by insurance so patient was eventually put on prednisone 20 mg with slow taper and that led to improvement in his symptoms clinically however his symptoms worsened with steroid tapering so he was put back up on 20 mg daily on 02/16/2020 for 10 days then tapered it off completely.    Patient continues to be symptomatic with worsening fecal calprotectin to 299 on 03/14/2020.  Treatment options were discussed and mutual decision was to start patient on Entyvio  which was started in April 2022 and he has been on maintenance every 8 weeks since then.    Patient had not been seen in clinic for 1.5 years but previously reported excellent response to Entyvio  but when I saw him last clinic visit in early 2020 for he stated that he has been going through a lot of stress due to his new job and has been experiencing diffuse abdominal pain and difficulty evacuating the stool, no other symptoms.  Patient was previously evaluated at [facility] and diagnosed with dyssynergia and we recommended pelvic floor training in the past.    Most recent endoscopic evaluation with EGD and colonoscopy on 02/07/2021.  EGD was normal.  Colonoscopy was normal except for external hemorrhoids.  Recommend to repeat colonoscopy in 3 years.  Histology from the esophagus and random colon biopsies both did not show diagnostic abnormalities.    When patient was seen last clinic visit in October 2024 he appeared to be in clinical remission.    Interval history:  Patient continue to do well while on Entyvio  but unfortunatelyd he changed his job in 2025 and his new job has been stressful that resulted in worsening GI symptoms.  Moreover, patient has had some delay in getting his Entyvio  infusions (unsure why, but per patient he has been waiting on IV X to schedule his infusions).  His most recent infusion was done in March 2025, so patient is currently approved for reload of Entyvio  followed by maintenance every 8 weeks.  Due to being off Entyvio , patient reports worsening symptoms including abdominal pain, occasionally sharp together with crampy pain in his stomach, increased urgency and frequency and currently patient reports having up to 10 bowel movements per day, occasionally bloody.    Most recent labs on 05/06/2023 showed no major abnormalities on CBC except for macrocytosis 93.7, normal BMP, mildly elevated ALT 53.  Otherwise normal LFTs.      Past Medical History:    Anxiety    Arrhythmia    Dysphagia    UC (ulcerative colitis) (CMS-HCC)       Surgical History:   Procedure Laterality Date    PREPARATION FECAL MICROBIOTA FOR INSTILLATION WITH ASSESSMENT DONOR SPECIMEN, administered via colonoscopy N/A 07/30/2018    Performed by Riccardo Sane, MD at [facility] ENDO    COLONOSCOPY VIA STOMA WITH BIOPSY  07/30/2018    Performed by Riccardo Sane, MD at [facility] ENDO    ESOPHAGOGASTRODUODENOSCOPY WITH BIOPSY - FLEXIBLE N/A 09/09/2018    Performed by Tobie Valdemar NOVAK, MD at [facility] ENDO    COLONOSCOPY WITH BIOPSY - FLEXIBLE  09/09/2018    Performed by Tobie Valdemar NOVAK, MD at [facility] ENDO    HEART CATHETERIZATION  10/2018    [facility]    ESOPHAGEAL MOTILITY STUDY N/A 01/08/2019    Performed by Melvenia Dana, MD at  [facility] ENDO    Colonoscopy N/A 06/03/2019    Performed by Buckles, Toribio BROCKS, MD at [facility] OR    Colonoscopy N/A 02/07/2021    Performed by Riccardo Sane, MD at [facility] OR    ESOPHAGOGASTRODUODENOSCOPY WITH BIOPSY - FLEXIBLE N/A 02/07/2021    Performed by Riccardo Sane, MD at [facility] OR       Social History     Socioeconomic History    Marital status: Married   Occupational History    Occupation: Teacher   Tobacco Use    Smoking status: Never    Smokeless tobacco: Former     Types: Chew     Quit date: 01/09/2008   Vaping Use    Vaping status: Never Used   Substance and Sexual Activity    Alcohol use: Yes     Alcohol/week: 10.0 standard drinks of alcohol     Types: 10 Shots of liquor per week     Comment: moderate    Drug use: Not Currently     Frequency: 2.0 times per week     Types: Marijuana    Sexual activity: Yes     Partners: Female     Birth control/protection: None            Review of Systems   Constitutional: Negative.    HENT: Negative.     Eyes: Negative.    Respiratory: Negative.     Cardiovascular: Negative.    Gastrointestinal: Negative.    Endocrine: Negative.    Genitourinary: Negative.    Musculoskeletal: Negative.    Skin: Negative.    Allergic/Immunologic: Negative.    Neurological: Negative.    Hematological: Negative.    Psychiatric/Behavioral: Negative.     All other systems reviewed and are negative.        Objective:          atenolol  (TENORMIN ) 25 mg tablet Take one tablet by mouth daily.    dicyclomine  (BENTYL ) 20 mg tablet Take one tablet by mouth twice daily.    hydrocortisone  2.5% (ANUSOL -HC) 2.5 % rectal cream To be used twice daily for hemorrhoids.    PARoxetine (PAXIL) 10 mg tablet Take one tablet by mouth daily.    vedolizumab  (ENTYVIO ) 300 mg injection Administer 5 mL through vein every 8 weeks. 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 Tobie after his colonoscopy and pathology x2 were suggestive of inflammatory bowel disease.  Patient was initially seen in our clinic on 09/16/2018  then most recently seen by Adam Case, NP 10/28/2022.  Today I video called the patient  for a telemedicine follow-up visit.        #Inflammatory bowel disease (favoring ulcerative colitis):  -First GI symptoms of mucousy stools with occasional blood starting October 2019   -Treated with prednisone, Cipro and Flagyl by his PCP in January 2020  -Colonoscopy  07/30/2018 showed normal ileum and colon.  However random colon biopsies showed chronic active colitis with cryptitis, crypt abscesses negative for viral inclusions and dysplasia.   - Repeated colonoscopy 09/09/2018 showed altered vascular pattern with mild inflamed mucosa in the transverse colon, and granular mucosa in the rectum otherwise normal.  Small bowel also looked normal.  Biopsies from the TI were normal, biopsies from the transverse colon and rectum showed active colitis with cryptitis and crypt abscesses.  Biopsies from the rest of the colon were normal.    - EGD the same day 9/1 that showed normal esophagus dilated up to 18 mm, small hiatal hernia, otherwise normal.  -Family history of severe complicated Crohn's disease in  his maternal grandmother  - Most recent labs from 08/2018 showed mild anemia and increased eosinophils  -Started on mesalamine  09/16/2018 taking Lialda  4.8 g daily with partial clinical response response.  However complete endoscopic and histologic response based on most recent colonoscopy from 06/03/2019.  - Patient was seen at [facility] for second opinion and plan was to continue on Lialda .  He had repeated colonoscopy there 10/30/2018 that was normal.  Biopsies from the TI, right colon, left colon were all normal.  Biopsies from the rectum did show mild focal active chronic colitis.  -Most recent colonoscopy 06/03/2019 showed internal hemorrhoids but otherwise normal TI and normal colon.  Also biopsies showed no evidence of active disease.  -Extraintestinal manifestations of scleritis.  No other extraintestinal manifestations  -No IBD related malignancies  -Switched to Asacol  HD per insurance preference and missed 4-5 days upon the switching time.  Currently back on 4.8 g daily of Asacol  HD with some improvement in his symptoms but with prednisone 20 mg but when he tapered his prednisone down his symptoms come back  -Started on Entyvio  April 2022, completed induction followed by maintenance every 8 weeks which is the current regimen [unfortunately he has not received Entyvio  since March 2025]  -Repeat colonoscopy 02/07/2022 was normal except for external hemorrhoids.  Normal histology.  Recommend to repeat colonoscopy in 3 years  -Currently worsening symptoms due to being off Entyvio  for 5 months including urgency, frequency, rectal bleeding and abdominal pain.    #Chronic high risk medication use [biologic therapy]:  -Started on Entyvio  April 2022, scheduled to complete induction this Friday followed by maintenance  [unfortunately he has not received Entyvio  since March 2025]    #Recurrent C. difficile infection:  -First diagnosed 04/07/2018, treated with a course of p.o. vancomycin  with no response  -Tested positive again for C. difficile on 05/19/2018.  He was then treated with a 10 day course of fidaxomicin .-Still mushy with occasional blood.  -Most recent positive C. difficile was on 06/24/2018 and patient eventually required F MT on 07/30/2018.   -Continue to be symptomatic with mushy diarrhea, infrequently bloody.  However this could be related to underlying inflammatory bowel disease/irritable bowel syndrome  -Repeat C. difficile PCR negative on 09/30/2019    #Elevated fecal calprotectin:  -Fecal calprotectin elevated 299 on 03/14/2020    #POTS:   -Follows with cardiology    # Elevated LFTs:  - Most recent labs on 05/06/2023 showed mildly elevated ALT [53], otherwise normal LFTs.  Plan:  Jessen initially had excellent response to Entyvio  and achieved clinical, endoscopic and histologic remission.  Unfortunately, he has not received Entyvio  since March 2025 that resulted in worsening symptoms including urgency, frequency and rectal bleeding.  At this time, our team will plan to reach out to IV X to see where the delay is from and since patient has been off therapy for 5 months we will plan to reload Entyvio  induction followed by maintenance every 8 weeks.  Today I also discussed with Welcome that Entyvio  is approved in subcutaneous injection for for maintenance every 2 weeks but he is preference is to continue infusions.  If next infusion cannot be scheduled within 1-2 weeks, to help with the symptoms patient is currently experiencing we will put him on Uceris 9 mg daily for 2 weeks, 9 mg every other day for 2 weeks, 9 mg every third day for 2 weeks then stop it.  Repeat labs including CBC, CMP, CRP, iron studies and B12 now.  Also obtain fecal calprotectin now then continue to monitor every 2-3 months to assess biochemical response to Entyvio   If suboptimal response to Entyvio  we will plan to repeat colonoscopy next available.  Otherwise, plan to repeat in 2026 as we have previously recommended  continue to monitor labs including CBC, CMP, CRP and fecal calprotectin every 2-3 months while on Entyvio   If worsening defecatory symptoms, refer to physical therapy for pelvic floor/biofeedback therapy  Continue to avoid NSAIDs and narcotics.  Tylenol  and tramadol okay for pain.  Healthcare maintenance was discussed as below  Follow-up accordingly  Return to clinic in 6 months with NP    Biologic therapy (High risk medications):    These medications are very powerful medication used to treat inflammatory bowel disease. However, a considerable number of patients with Crohn's disease and ulcerative colitis do not have a clinically relevant response to currently available biologic therapies (primary treatment failure).  Skin cancer risk is increased with some of the biologic therapies.  Therefore patients need to wear sun screen and undergo a full body skin cancer check each year by a Dermatologist.     The following assessments should be performed before the initiation of treatment and at regular intervals (every 8 to 12 weeks) during treatment: urinalysis, a complete blood count, and measurement of levels of acute-phase reactants (e.g., C-reactive protein), creatinine, and electrolytes.   Measurement of liver enzyme levels should be performed every 2 weeks in the first 1 to 2 months of treatment and every 8 to 12 weeks thereafter. In addition, patients should be monitored carefully for symptoms and signs suggestive of cancer or the worsening of a coexisting illness, such as congestive heart failure or diabetes.    Pt will need to contact primary physician's office if develops any signs of an infection.  If they have a suspected infection or are being treated for a current infection Patients CAN NOT take this medication until the infection has completely resolved.  Patient has to Contact my office if develops persistent fevers, chills, night sweats or swollen lymph nodes or glands.  Patient should NOT receive any LIVE VIRUS VACCINES [live vaccines include: nasal influenza, MMR, Varicella-Zoster (chicken pox vaccines), Rotavirus].  Shingrix vaccine CAN be taken.   Patient will need a yearly influenza vaccine ( NOT the nasal influenza vaccine; only receive the shot).  Patient will need a pneumonia vaccination; then will also need pneumonia vaccine booster in 5 years.  If never received this vaccination, it has to  be received, needs to contact PCP for that.  Male Patient has to discuss HPV vaccination with primary physician or gynecologist, beside updated pap smear.     IBD Health Maintenance: Discussed with the patient today  1. Influenza vaccine: Received in 2024, continue to recommend annual flu vaccine.  2. Pneumonia vaccine: Discussed with the patient again today and we recommended to receive pneumonia vaccine.  3. Dermatology: Recommended annual skin exam by dermatology.  Patient stated that he will work on finding his own dermatologist for annual exam.  4. Gyne: NA  5. Bone density: Consider to check if on steroids for > 3 months  6. Tobacco use: No  7. Surveillance colonoscopy:  2023  8. Biologic use: Entyvio  every 8 weeks  9. Extraintestinal manifestations: Scleritis  10. Malignancy: No  11. COVID-19 vaccine: Only received 1 dose.  We recommended a booster  12. Shingles vaccine: This was discussed with the patient today and patient was advised to receive shingles vaccine through PCP    Patient was advised of the plan and voiced agreement and understanding.     This note was in part completed with Dragon, a voice to text dictation system. Some errors may have occured and persist despite my best efforts to edit this document to eliminate dictation/translation related errors.  If you have questions/concerns, please contact me for clarification.     Total time of 40 minutes was spent on this encounter.  This includes preparing to see the patient, reviewing previous notes, obtaining and reviewing separately obtained history, counseling and educating the patient about disease and medications used including high risk medications, ordering medications/tests/procedures, reviewing health care maintenance for high risk medications, documenting clinical information in the electronic medical record, interpreting results and communicating results to the patient.    Gwenette Riccardo COME  Gastroenterology attending  Pager: [phone]

## 2023-09-04 LAB — CBC AND DIFF
ABSOLUTE BASO COUNT: 0
ABSOLUTE EOS COUNT: 0.2
ABSOLUTE LYMPH COUNT: 1.7
ABSOLUTE MONO COUNT: 0.4
ABSOLUTE NEUTROPHIL: 4.6
BASOPHILS %: 0.6
EOSINOPHIL %: 3
HEMATOCRIT: 41
HEMOGLOBIN: 14
LYMPHOCYTES %: 24
MCH: 30
MCHC: 33
MCV: 90
MONOCYTES %: 6.4
MPV: 9.8
NEUTROPHILS %: 65
PLATELET COUNT: 286
RBC COUNT: 4.6 — ABNORMAL LOW
RDW: 12
WBC COUNT: 7

## 2023-09-04 LAB — IRON + BINDING CAPACITY + %SAT+ FERRITIN
% SATURATION: 23
FERRITIN: 59
IRON BINDING: 309
IRON: 91

## 2023-09-04 LAB — COMPREHENSIVE METABOLIC PANEL
ALBUMIN: 4 mg/dL (ref 0.40–1.24)
ALK PHOSPHATASE: 74 mg/dL (ref 8.5–10.6)
ALT: 33 mg/dL (ref 0.2–1.3)
ANION GAP: 9 U/L (ref 7–40)
AST: 26 10*3/uL (ref 150–400)
BLD UREA NITROGEN: 11 10*3/uL (ref 4.50–11.00)
CALCIUM: 9.8 mmol/L — ABNORMAL LOW (ref 98–110)
CHLORIDE: 105 [IU]/mL (ref 0.35–5.00)
CO2: 27 ng/dL (ref 0.6–1.6)
CREATININE: 0.9 10*6/uL — ABNORMAL LOW (ref 4.40–5.50)
GFR ESTIMATED: 103 g/dL (ref 3.5–5.0)
GLUCOSE,PANEL: 86 mmol/L — ABNORMAL LOW (ref 3.5–5.1)
POTASSIUM: 3.8
SODIUM: 141
TOTAL BILIRUBIN: 0.5 mg/dL (ref 7–25)
TOTAL PROTEIN: 7.4 fL (ref 80.0–100.0)

## 2023-09-04 LAB — C REACTIVE PROTEIN (CRP): C-REACTIVE PROTEIN: 0.4 mg/dL — ABNORMAL HIGH

## 2023-09-11 ENCOUNTER — Encounter: Admit: 2023-09-11 | Discharge: 2023-09-11 | Payer: PRIVATE HEALTH INSURANCE

## 2023-09-11 DIAGNOSIS — K5100 Ulcerative (chronic) pancolitis without complications: Principal | ICD-10-CM

## 2023-09-11 DIAGNOSIS — Z79899 Other long term (current) drug therapy: Secondary | ICD-10-CM

## 2023-09-14 ENCOUNTER — Encounter: Admit: 2023-09-14 | Discharge: 2023-09-14 | Payer: PRIVATE HEALTH INSURANCE

## 2023-09-17 ENCOUNTER — Encounter: Admit: 2023-09-17 | Discharge: 2023-09-17 | Payer: PRIVATE HEALTH INSURANCE

## 2023-09-20 ENCOUNTER — Encounter: Admit: 2023-09-20 | Discharge: 2023-09-20 | Payer: PRIVATE HEALTH INSURANCE

## 2023-09-25 ENCOUNTER — Encounter: Admit: 2023-09-25 | Discharge: 2023-09-25 | Payer: PRIVATE HEALTH INSURANCE

## 2023-10-03 ENCOUNTER — Encounter: Admit: 2023-10-03 | Discharge: 2023-10-03 | Payer: PRIVATE HEALTH INSURANCE

## 2023-10-16 ENCOUNTER — Encounter: Admit: 2023-10-16 | Discharge: 2023-10-16 | Payer: PRIVATE HEALTH INSURANCE

## 2023-12-26 ENCOUNTER — Encounter: Admit: 2023-12-26 | Discharge: 2023-12-26 | Payer: PRIVATE HEALTH INSURANCE

## 2023-12-26 NOTE — Progress Notes [1]
 Nurses notes received from: [facility]  Medication: Entyvio  q8w   Infusion Date: 12/23/23  Next Infusion: 02/17/24  Labs Drawn?  Y   Notes reviewed; no reactions or difficulty documented.   See notes scanned in and copied below for reference
